# Patient Record
Sex: Male | Born: 1960 | Race: White | Hispanic: No | Marital: Married | Smoking: Former smoker
Health system: Southern US, Community
[De-identification: ages and names within clinical notes are randomized; demographics above are authoritative.]

## PROBLEM LIST (undated history)

## (undated) DIAGNOSIS — Z87442 Personal history of urinary calculi: Secondary | ICD-10-CM

## (undated) DIAGNOSIS — E785 Hyperlipidemia, unspecified: Secondary | ICD-10-CM

## (undated) DIAGNOSIS — Z950 Presence of cardiac pacemaker: Secondary | ICD-10-CM

## (undated) DIAGNOSIS — I739 Peripheral vascular disease, unspecified: Secondary | ICD-10-CM

## (undated) DIAGNOSIS — I255 Ischemic cardiomyopathy: Secondary | ICD-10-CM

## (undated) DIAGNOSIS — K219 Gastro-esophageal reflux disease without esophagitis: Secondary | ICD-10-CM

## (undated) DIAGNOSIS — I10 Essential (primary) hypertension: Secondary | ICD-10-CM

## (undated) DIAGNOSIS — G8929 Other chronic pain: Secondary | ICD-10-CM

## (undated) DIAGNOSIS — I2510 Atherosclerotic heart disease of native coronary artery without angina pectoris: Secondary | ICD-10-CM

## (undated) DIAGNOSIS — I219 Acute myocardial infarction, unspecified: Secondary | ICD-10-CM

## (undated) DIAGNOSIS — Z95810 Presence of automatic (implantable) cardiac defibrillator: Secondary | ICD-10-CM

## (undated) DIAGNOSIS — I5022 Chronic systolic (congestive) heart failure: Secondary | ICD-10-CM

## (undated) DIAGNOSIS — I509 Heart failure, unspecified: Secondary | ICD-10-CM

## (undated) HISTORY — PX: CARDIAC CATHETERIZATION: SHX172

## (undated) HISTORY — DX: Peripheral vascular disease, unspecified: I73.9

## (undated) HISTORY — PX: OTHER SURGICAL HISTORY: SHX169

## (undated) HISTORY — PX: CORONARY ANGIOPLASTY: SHX604

## (undated) HISTORY — PX: INSERT / REPLACE / REMOVE PACEMAKER: SUR710

---

## 2011-11-06 DIAGNOSIS — R9389 Abnormal findings on diagnostic imaging of other specified body structures: Secondary | ICD-10-CM | POA: Insufficient documentation

## 2011-11-06 DIAGNOSIS — I219 Acute myocardial infarction, unspecified: Secondary | ICD-10-CM | POA: Insufficient documentation

## 2012-04-18 DIAGNOSIS — M25519 Pain in unspecified shoulder: Secondary | ICD-10-CM | POA: Insufficient documentation

## 2012-05-30 DIAGNOSIS — G894 Chronic pain syndrome: Secondary | ICD-10-CM | POA: Insufficient documentation

## 2012-05-30 DIAGNOSIS — Z79891 Long term (current) use of opiate analgesic: Secondary | ICD-10-CM | POA: Insufficient documentation

## 2012-05-30 DIAGNOSIS — M5412 Radiculopathy, cervical region: Secondary | ICD-10-CM | POA: Insufficient documentation

## 2012-07-11 DIAGNOSIS — R0789 Other chest pain: Secondary | ICD-10-CM | POA: Insufficient documentation

## 2014-10-23 DIAGNOSIS — E876 Hypokalemia: Secondary | ICD-10-CM | POA: Insufficient documentation

## 2014-10-23 DIAGNOSIS — D509 Iron deficiency anemia, unspecified: Secondary | ICD-10-CM | POA: Insufficient documentation

## 2014-10-23 DIAGNOSIS — R079 Chest pain, unspecified: Secondary | ICD-10-CM | POA: Insufficient documentation

## 2015-02-07 DIAGNOSIS — I429 Cardiomyopathy, unspecified: Secondary | ICD-10-CM | POA: Insufficient documentation

## 2015-02-07 DIAGNOSIS — I5022 Chronic systolic (congestive) heart failure: Secondary | ICD-10-CM | POA: Insufficient documentation

## 2015-09-02 ENCOUNTER — Other Ambulatory Visit: Payer: Self-pay | Admitting: Neurosurgery

## 2015-09-02 DIAGNOSIS — M542 Cervicalgia: Principal | ICD-10-CM

## 2015-09-02 DIAGNOSIS — M5442 Lumbago with sciatica, left side: Secondary | ICD-10-CM

## 2015-09-02 DIAGNOSIS — G8929 Other chronic pain: Secondary | ICD-10-CM

## 2015-09-27 ENCOUNTER — Other Ambulatory Visit: Payer: Self-pay

## 2015-10-03 ENCOUNTER — Ambulatory Visit
Admission: RE | Admit: 2015-10-03 | Discharge: 2015-10-03 | Disposition: A | Discharge status: Home / Self Care | Payer: Worker's Compensation | Source: Ambulatory Visit | Attending: Neurosurgery | Admitting: Neurosurgery

## 2015-10-03 DIAGNOSIS — K219 Gastro-esophageal reflux disease without esophagitis: Secondary | ICD-10-CM | POA: Insufficient documentation

## 2015-10-03 DIAGNOSIS — M5442 Lumbago with sciatica, left side: Secondary | ICD-10-CM

## 2015-10-03 DIAGNOSIS — M542 Cervicalgia: Secondary | ICD-10-CM

## 2015-10-03 DIAGNOSIS — Z95810 Presence of automatic (implantable) cardiac defibrillator: Secondary | ICD-10-CM | POA: Insufficient documentation

## 2015-10-03 DIAGNOSIS — Z87891 Personal history of nicotine dependence: Secondary | ICD-10-CM | POA: Insufficient documentation

## 2015-10-03 DIAGNOSIS — I2510 Atherosclerotic heart disease of native coronary artery without angina pectoris: Secondary | ICD-10-CM | POA: Insufficient documentation

## 2015-10-03 DIAGNOSIS — G8929 Other chronic pain: Secondary | ICD-10-CM | POA: Insufficient documentation

## 2015-10-03 DIAGNOSIS — M5030 Other cervical disc degeneration, unspecified cervical region: Secondary | ICD-10-CM | POA: Insufficient documentation

## 2015-10-03 DIAGNOSIS — I252 Old myocardial infarction: Secondary | ICD-10-CM | POA: Insufficient documentation

## 2015-10-03 DIAGNOSIS — M545 Low back pain, unspecified: Secondary | ICD-10-CM | POA: Insufficient documentation

## 2015-10-03 DIAGNOSIS — Z955 Presence of coronary angioplasty implant and graft: Secondary | ICD-10-CM | POA: Insufficient documentation

## 2015-10-03 DIAGNOSIS — Z8249 Family history of ischemic heart disease and other diseases of the circulatory system: Secondary | ICD-10-CM | POA: Insufficient documentation

## 2015-10-03 DIAGNOSIS — I10 Essential (primary) hypertension: Secondary | ICD-10-CM | POA: Insufficient documentation

## 2015-10-03 DIAGNOSIS — E785 Hyperlipidemia, unspecified: Secondary | ICD-10-CM | POA: Insufficient documentation

## 2015-10-03 MED ORDER — DIAZEPAM 5 MG PO TABS
10.0000 mg | ORAL_TABLET | Freq: Once | ORAL | Status: AC
  Administered 2015-10-03: 10 mg via ORAL

## 2015-10-03 MED ORDER — IOPAMIDOL (ISOVUE-M 200) INJECTION 41%
15.0000 mL | Freq: Once | INTRAMUSCULAR | Status: AC
Start: 2015-10-03 — End: 2015-10-03
  Administered 2015-10-03: 15 mL via INTRATHECAL

## 2015-10-03 MED ORDER — MEPERIDINE HCL 100 MG/ML IJ SOLN
100.0000 mg | Freq: Once | INTRAMUSCULAR | Status: AC
  Administered 2015-10-03: 100 mg via INTRAMUSCULAR

## 2015-10-03 MED ORDER — HYDROXYZINE HCL 50 MG/ML IM SOLN
25.0000 mg | Freq: Once | INTRAMUSCULAR | Status: AC
  Administered 2015-10-03: 25 mg via INTRAMUSCULAR

## 2015-10-03 MED ORDER — ONDANSETRON 8 MG PO TBDP
8.0000 mg | ORAL_TABLET | Freq: Once | ORAL | Status: AC
  Administered 2015-10-03: 8 mg via ORAL

## 2015-10-03 NOTE — Discharge Instructions (Signed)
Myelogram Discharge Instructions  1. Go home and rest quietly for the next 24 hours.  It is important to lie flat for the next 24 hours.  Get up only to go to the restroom.  You may lie in the bed or on a couch on your back, your stomach, your left side or your right side.  You may have one pillow under your head.  You may have pillows between your knees while you are on your side or under your knees while you are on your back.  2. DO NOT drive today.  Recline the seat as far back as it will go, while still wearing your seat belt, on the way home.  3. You may get up to go to the bathroom as needed.  You may sit up for 10 minutes to eat.  You may resume your normal diet and medications unless otherwise indicated.  Drink lots of extra fluids today and tomorrow.  4. The incidence of headache, nausea, or vomiting is about 5% (one in 20 patients).  If you develop a headache, lie flat and drink plenty of fluids until the headache goes away.  Caffeinated beverages may be helpful.  If you develop severe nausea and vomiting or a headache that does not go away with flat bed rest, call [phone].  5. You may resume normal activities after your 24 hours of bed rest is over; however, do not exert yourself strongly or do any heavy lifting tomorrow. If when you get up you have a headache when standing, go back to bed and force fluids for another 24 hours.  6. Call your physician for a follow-up appointment.  The results of your myelogram will be sent directly to your physician by the following day.  7. If you have any questions or if complications develop after you arrive home, please call [phone].  Discharge instructions have been explained to the patient.  The patient, or the person responsible for the patient, fully understands these instructions.       MAY RESUME PLAVIX TODAY.   May resume Wellbutrin on October 04, 2015, after 11:00 am.

## 2015-10-03 NOTE — Progress Notes (Signed)
Patient states he has been off Wellbutrin for the past two days and Plavix for the past five days.

## 2017-04-30 DIAGNOSIS — M1990 Unspecified osteoarthritis, unspecified site: Secondary | ICD-10-CM

## 2017-04-30 HISTORY — DX: Unspecified osteoarthritis, unspecified site: M19.90

## 2019-06-14 ENCOUNTER — Encounter (HOSPITAL_COMMUNITY): Payer: Self-pay | Admitting: *Deleted

## 2019-06-14 ENCOUNTER — Other Ambulatory Visit: Payer: Self-pay

## 2019-06-14 ENCOUNTER — Inpatient Hospital Stay (HOSPITAL_COMMUNITY)
Admission: EM | Admit: 2019-06-14 | Discharge: 2019-06-16 | DRG: 291 | Disposition: A | Discharge status: Home / Self Care | Payer: Medicare Other | Attending: Internal Medicine | Admitting: Internal Medicine

## 2019-06-14 ENCOUNTER — Emergency Department (HOSPITAL_COMMUNITY): Payer: Medicare Other

## 2019-06-14 DIAGNOSIS — G894 Chronic pain syndrome: Secondary | ICD-10-CM | POA: Diagnosis present

## 2019-06-14 DIAGNOSIS — R4182 Altered mental status, unspecified: Secondary | ICD-10-CM | POA: Diagnosis not present

## 2019-06-14 DIAGNOSIS — I712 Thoracic aortic aneurysm, without rupture: Secondary | ICD-10-CM | POA: Diagnosis present

## 2019-06-14 DIAGNOSIS — G8929 Other chronic pain: Secondary | ICD-10-CM | POA: Diagnosis present

## 2019-06-14 DIAGNOSIS — E669 Obesity, unspecified: Secondary | ICD-10-CM | POA: Diagnosis present

## 2019-06-14 DIAGNOSIS — E785 Hyperlipidemia, unspecified: Secondary | ICD-10-CM | POA: Diagnosis present

## 2019-06-14 DIAGNOSIS — M542 Cervicalgia: Secondary | ICD-10-CM | POA: Diagnosis present

## 2019-06-14 DIAGNOSIS — Z981 Arthrodesis status: Secondary | ICD-10-CM

## 2019-06-14 DIAGNOSIS — I429 Cardiomyopathy, unspecified: Secondary | ICD-10-CM

## 2019-06-14 DIAGNOSIS — Z6832 Body mass index (BMI) 32.0-32.9, adult: Secondary | ICD-10-CM

## 2019-06-14 DIAGNOSIS — M5011 Cervical disc disorder with radiculopathy,  high cervical region: Secondary | ICD-10-CM | POA: Diagnosis present

## 2019-06-14 DIAGNOSIS — Z955 Presence of coronary angioplasty implant and graft: Secondary | ICD-10-CM

## 2019-06-14 DIAGNOSIS — I110 Hypertensive heart disease with heart failure: Principal | ICD-10-CM | POA: Diagnosis present

## 2019-06-14 DIAGNOSIS — Z8249 Family history of ischemic heart disease and other diseases of the circulatory system: Secondary | ICD-10-CM

## 2019-06-14 DIAGNOSIS — Z95810 Presence of automatic (implantable) cardiac defibrillator: Secondary | ICD-10-CM

## 2019-06-14 DIAGNOSIS — I255 Ischemic cardiomyopathy: Secondary | ICD-10-CM | POA: Diagnosis present

## 2019-06-14 DIAGNOSIS — Z7982 Long term (current) use of aspirin: Secondary | ICD-10-CM

## 2019-06-14 DIAGNOSIS — U071 COVID-19: Secondary | ICD-10-CM | POA: Diagnosis present

## 2019-06-14 DIAGNOSIS — D509 Iron deficiency anemia, unspecified: Secondary | ICD-10-CM | POA: Diagnosis present

## 2019-06-14 DIAGNOSIS — I2510 Atherosclerotic heart disease of native coronary artery without angina pectoris: Secondary | ICD-10-CM | POA: Diagnosis present

## 2019-06-14 DIAGNOSIS — N179 Acute kidney failure, unspecified: Secondary | ICD-10-CM | POA: Diagnosis present

## 2019-06-14 DIAGNOSIS — I4510 Unspecified right bundle-branch block: Secondary | ICD-10-CM | POA: Diagnosis present

## 2019-06-14 DIAGNOSIS — Z87891 Personal history of nicotine dependence: Secondary | ICD-10-CM

## 2019-06-14 DIAGNOSIS — Z888 Allergy status to other drugs, medicaments and biological substances status: Secondary | ICD-10-CM

## 2019-06-14 DIAGNOSIS — Z79899 Other long term (current) drug therapy: Secondary | ICD-10-CM

## 2019-06-14 DIAGNOSIS — K219 Gastro-esophageal reflux disease without esophagitis: Secondary | ICD-10-CM | POA: Diagnosis present

## 2019-06-14 DIAGNOSIS — G9341 Metabolic encephalopathy: Secondary | ICD-10-CM | POA: Diagnosis present

## 2019-06-14 DIAGNOSIS — I252 Old myocardial infarction: Secondary | ICD-10-CM

## 2019-06-14 DIAGNOSIS — I509 Heart failure, unspecified: Secondary | ICD-10-CM

## 2019-06-14 DIAGNOSIS — I5023 Acute on chronic systolic (congestive) heart failure: Secondary | ICD-10-CM | POA: Diagnosis present

## 2019-06-14 DIAGNOSIS — Z66 Do not resuscitate: Secondary | ICD-10-CM | POA: Diagnosis present

## 2019-06-14 HISTORY — DX: Essential (primary) hypertension: I10

## 2019-06-14 HISTORY — DX: Heart failure, unspecified: I50.9

## 2019-06-14 HISTORY — DX: Other chronic pain: G89.29

## 2019-06-14 HISTORY — DX: Chronic systolic (congestive) heart failure: I50.22

## 2019-06-14 HISTORY — DX: Presence of cardiac pacemaker: Z95.0

## 2019-06-14 HISTORY — DX: Hyperlipidemia, unspecified: E78.5

## 2019-06-14 HISTORY — DX: Atherosclerotic heart disease of native coronary artery without angina pectoris: I25.10

## 2019-06-14 HISTORY — DX: Ischemic cardiomyopathy: I25.5

## 2019-06-14 LAB — CBC WITH DIFFERENTIAL/PLATELET
Abs Immature Granulocytes: 0.03 10*3/uL (ref 0.00–0.07)
Basophils Absolute: 0 10*3/uL (ref 0.0–0.1)
Basophils Relative: 0 %
Eosinophils Absolute: 0.2 10*3/uL (ref 0.0–0.5)
Eosinophils Relative: 2 %
HCT: 39.3 % (ref 39.0–52.0)
Hemoglobin: 12.9 g/dL — ABNORMAL LOW (ref 13.0–17.0)
Immature Granulocytes: 0 %
Lymphocytes Relative: 20 %
Lymphs Abs: 1.7 10*3/uL (ref 0.7–4.0)
MCH: 29.9 pg (ref 26.0–34.0)
MCHC: 32.8 g/dL (ref 30.0–36.0)
MCV: 91.2 fL (ref 80.0–100.0)
Monocytes Absolute: 0.8 10*3/uL (ref 0.1–1.0)
Monocytes Relative: 9 %
Neutro Abs: 6.1 10*3/uL (ref 1.7–7.7)
Neutrophils Relative %: 69 %
Platelets: 201 10*3/uL (ref 150–400)
RBC: 4.31 MIL/uL (ref 4.22–5.81)
RDW: 12.3 % (ref 11.5–15.5)
WBC: 8.8 10*3/uL (ref 4.0–10.5)
nRBC: 0 % (ref 0.0–0.2)

## 2019-06-14 LAB — COMPREHENSIVE METABOLIC PANEL
ALT: 31 U/L (ref 0–44)
AST: 38 U/L (ref 15–41)
Albumin: 4 g/dL (ref 3.5–5.0)
Alkaline Phosphatase: 73 U/L (ref 38–126)
Anion gap: 9 (ref 5–15)
BUN: 43 mg/dL — ABNORMAL HIGH (ref 6–20)
CO2: 21 mmol/L — ABNORMAL LOW (ref 22–32)
Calcium: 8.7 mg/dL — ABNORMAL LOW (ref 8.9–10.3)
Chloride: 110 mmol/L (ref 98–111)
Creatinine, Ser: 1.83 mg/dL — ABNORMAL HIGH (ref 0.61–1.24)
GFR calc Af Amer: 46 mL/min — ABNORMAL LOW (ref >60)
GFR calc non Af Amer: 40 mL/min — ABNORMAL LOW (ref >60)
Glucose, Bld: 105 mg/dL — ABNORMAL HIGH (ref 70–99)
Potassium: 4.7 mmol/L (ref 3.5–5.1)
Sodium: 140 mmol/L (ref 135–145)
Total Bilirubin: 1.1 mg/dL (ref 0.3–1.2)
Total Protein: 6.5 g/dL (ref 6.5–8.1)

## 2019-06-14 LAB — AMMONIA: Ammonia: 22 umol/L (ref 9–35)

## 2019-06-14 LAB — BRAIN NATRIURETIC PEPTIDE: B Natriuretic Peptide: 303.7 pg/mL — ABNORMAL HIGH (ref 0.0–100.0)

## 2019-06-14 LAB — D-DIMER, QUANTITATIVE: D-Dimer, Quant: 0.79 ug/mL-FEU — ABNORMAL HIGH (ref 0.00–0.50)

## 2019-06-14 MED ORDER — IOHEXOL 350 MG/ML SOLN
60.0000 mL | Freq: Once | INTRAVENOUS | Status: AC | PRN
  Administered 2019-06-14: 23:00:00 60 mL via INTRAVENOUS

## 2019-06-14 MED ORDER — MORPHINE SULFATE (PF) 4 MG/ML IV SOLN
4.0000 mg | Freq: Once | INTRAVENOUS | Status: AC
  Administered 2019-06-14: 4 mg via INTRAVENOUS
  Filled 2019-06-14: qty 1

## 2019-06-14 NOTE — ED Notes (Signed)
Port chest x-ray done
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
## 2019-06-14 NOTE — ED Notes (Signed)
The pt continues to moan and groan med given
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
## 2019-06-14 NOTE — ED Notes (Signed)
The pt keeps getting up to void then gets tangled up in  The wires and cannot  Figure out to get intangled
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
## 2019-06-14 NOTE — ED Notes (Signed)
No bm for 2 weeks  Taking narcotics

## 2019-06-15 ENCOUNTER — Encounter (HOSPITAL_COMMUNITY): Payer: Self-pay | Admitting: Internal Medicine

## 2019-06-15 ENCOUNTER — Inpatient Hospital Stay (HOSPITAL_COMMUNITY): Payer: Medicare Other

## 2019-06-15 ENCOUNTER — Other Ambulatory Visit: Payer: Self-pay

## 2019-06-15 DIAGNOSIS — I5041 Acute combined systolic (congestive) and diastolic (congestive) heart failure: Secondary | ICD-10-CM | POA: Diagnosis not present

## 2019-06-15 DIAGNOSIS — Z955 Presence of coronary angioplasty implant and graft: Secondary | ICD-10-CM | POA: Diagnosis not present

## 2019-06-15 DIAGNOSIS — J9600 Acute respiratory failure, unspecified whether with hypoxia or hypercapnia: Secondary | ICD-10-CM | POA: Diagnosis not present

## 2019-06-15 DIAGNOSIS — N179 Acute kidney failure, unspecified: Secondary | ICD-10-CM | POA: Diagnosis present

## 2019-06-15 DIAGNOSIS — M542 Cervicalgia: Secondary | ICD-10-CM

## 2019-06-15 DIAGNOSIS — R609 Edema, unspecified: Secondary | ICD-10-CM

## 2019-06-15 DIAGNOSIS — I110 Hypertensive heart disease with heart failure: Secondary | ICD-10-CM | POA: Diagnosis present

## 2019-06-15 DIAGNOSIS — I429 Cardiomyopathy, unspecified: Secondary | ICD-10-CM | POA: Diagnosis not present

## 2019-06-15 DIAGNOSIS — I252 Old myocardial infarction: Secondary | ICD-10-CM | POA: Diagnosis not present

## 2019-06-15 DIAGNOSIS — G8929 Other chronic pain: Secondary | ICD-10-CM | POA: Diagnosis not present

## 2019-06-15 DIAGNOSIS — G894 Chronic pain syndrome: Secondary | ICD-10-CM | POA: Diagnosis present

## 2019-06-15 DIAGNOSIS — Z7189 Other specified counseling: Secondary | ICD-10-CM | POA: Diagnosis not present

## 2019-06-15 DIAGNOSIS — I5021 Acute systolic (congestive) heart failure: Secondary | ICD-10-CM | POA: Diagnosis not present

## 2019-06-15 DIAGNOSIS — E785 Hyperlipidemia, unspecified: Secondary | ICD-10-CM | POA: Diagnosis present

## 2019-06-15 DIAGNOSIS — I5023 Acute on chronic systolic (congestive) heart failure: Secondary | ICD-10-CM | POA: Diagnosis present

## 2019-06-15 DIAGNOSIS — I2510 Atherosclerotic heart disease of native coronary artery without angina pectoris: Secondary | ICD-10-CM | POA: Diagnosis present

## 2019-06-15 DIAGNOSIS — I255 Ischemic cardiomyopathy: Secondary | ICD-10-CM | POA: Diagnosis present

## 2019-06-15 DIAGNOSIS — Z981 Arthrodesis status: Secondary | ICD-10-CM | POA: Diagnosis not present

## 2019-06-15 DIAGNOSIS — Z95810 Presence of automatic (implantable) cardiac defibrillator: Secondary | ICD-10-CM

## 2019-06-15 DIAGNOSIS — E669 Obesity, unspecified: Secondary | ICD-10-CM | POA: Diagnosis present

## 2019-06-15 DIAGNOSIS — R4182 Altered mental status, unspecified: Secondary | ICD-10-CM | POA: Diagnosis present

## 2019-06-15 DIAGNOSIS — D509 Iron deficiency anemia, unspecified: Secondary | ICD-10-CM | POA: Diagnosis present

## 2019-06-15 DIAGNOSIS — I712 Thoracic aortic aneurysm, without rupture: Secondary | ICD-10-CM | POA: Diagnosis present

## 2019-06-15 DIAGNOSIS — Z87891 Personal history of nicotine dependence: Secondary | ICD-10-CM | POA: Diagnosis not present

## 2019-06-15 DIAGNOSIS — K219 Gastro-esophageal reflux disease without esophagitis: Secondary | ICD-10-CM | POA: Diagnosis present

## 2019-06-15 DIAGNOSIS — Z8249 Family history of ischemic heart disease and other diseases of the circulatory system: Secondary | ICD-10-CM | POA: Diagnosis not present

## 2019-06-15 DIAGNOSIS — U071 COVID-19: Secondary | ICD-10-CM | POA: Diagnosis present

## 2019-06-15 DIAGNOSIS — G9341 Metabolic encephalopathy: Secondary | ICD-10-CM | POA: Diagnosis present

## 2019-06-15 DIAGNOSIS — I4510 Unspecified right bundle-branch block: Secondary | ICD-10-CM | POA: Diagnosis present

## 2019-06-15 DIAGNOSIS — I509 Heart failure, unspecified: Secondary | ICD-10-CM

## 2019-06-15 DIAGNOSIS — Z6832 Body mass index (BMI) 32.0-32.9, adult: Secondary | ICD-10-CM | POA: Diagnosis not present

## 2019-06-15 DIAGNOSIS — Z66 Do not resuscitate: Secondary | ICD-10-CM | POA: Diagnosis present

## 2019-06-15 DIAGNOSIS — Z7982 Long term (current) use of aspirin: Secondary | ICD-10-CM | POA: Diagnosis not present

## 2019-06-15 DIAGNOSIS — M5011 Cervical disc disorder with radiculopathy,  high cervical region: Secondary | ICD-10-CM | POA: Diagnosis present

## 2019-06-15 LAB — CBC WITH DIFFERENTIAL/PLATELET
Abs Immature Granulocytes: 0.01 10*3/uL (ref 0.00–0.07)
Basophils Absolute: 0 10*3/uL (ref 0.0–0.1)
Basophils Relative: 0 %
Eosinophils Absolute: 0.1 10*3/uL (ref 0.0–0.5)
Eosinophils Relative: 1 %
HCT: 41.8 % (ref 39.0–52.0)
Hemoglobin: 14 g/dL (ref 13.0–17.0)
Immature Granulocytes: 0 %
Lymphocytes Relative: 22 %
Lymphs Abs: 1.7 10*3/uL (ref 0.7–4.0)
MCH: 29.7 pg (ref 26.0–34.0)
MCHC: 33.5 g/dL (ref 30.0–36.0)
MCV: 88.6 fL (ref 80.0–100.0)
Monocytes Absolute: 0.8 10*3/uL (ref 0.1–1.0)
Monocytes Relative: 10 %
Neutro Abs: 5 10*3/uL (ref 1.7–7.7)
Neutrophils Relative %: 67 %
Platelets: 232 10*3/uL (ref 150–400)
RBC: 4.72 MIL/uL (ref 4.22–5.81)
RDW: 12.5 % (ref 11.5–15.5)
WBC: 7.6 10*3/uL (ref 4.0–10.5)
nRBC: 0 % (ref 0.0–0.2)

## 2019-06-15 LAB — URINALYSIS, ROUTINE W REFLEX MICROSCOPIC
Bilirubin Urine: NEGATIVE
Glucose, UA: NEGATIVE mg/dL
Hgb urine dipstick: NEGATIVE
Ketones, ur: NEGATIVE mg/dL
Leukocytes,Ua: NEGATIVE
Nitrite: NEGATIVE
Protein, ur: NEGATIVE mg/dL
Specific Gravity, Urine: 1.006 (ref 1.005–1.030)
pH: 5 (ref 5.0–8.0)

## 2019-06-15 LAB — SARS CORONAVIRUS 2 (TAT 6-24 HRS): SARS Coronavirus 2: POSITIVE — AB

## 2019-06-15 LAB — HIV ANTIBODY (ROUTINE TESTING W REFLEX): HIV Screen 4th Generation wRfx: NONREACTIVE

## 2019-06-15 LAB — COMPREHENSIVE METABOLIC PANEL
ALT: 36 U/L (ref 0–44)
AST: 46 U/L — ABNORMAL HIGH (ref 15–41)
Albumin: 4.2 g/dL (ref 3.5–5.0)
Alkaline Phosphatase: 85 U/L (ref 38–126)
Anion gap: 14 (ref 5–15)
BUN: 36 mg/dL — ABNORMAL HIGH (ref 6–20)
CO2: 25 mmol/L (ref 22–32)
Calcium: 9.6 mg/dL (ref 8.9–10.3)
Chloride: 102 mmol/L (ref 98–111)
Creatinine, Ser: 1.74 mg/dL — ABNORMAL HIGH (ref 0.61–1.24)
GFR calc Af Amer: 49 mL/min — ABNORMAL LOW (ref >60)
GFR calc non Af Amer: 42 mL/min — ABNORMAL LOW (ref >60)
Glucose, Bld: 105 mg/dL — ABNORMAL HIGH (ref 70–99)
Potassium: 4.4 mmol/L (ref 3.5–5.1)
Sodium: 141 mmol/L (ref 135–145)
Total Bilirubin: 1.4 mg/dL — ABNORMAL HIGH (ref 0.3–1.2)
Total Protein: 6.9 g/dL (ref 6.5–8.1)

## 2019-06-15 LAB — TROPONIN I (HIGH SENSITIVITY)
Troponin I (High Sensitivity): 69 ng/L — ABNORMAL HIGH (ref <18)
Troponin I (High Sensitivity): 60 ng/L — ABNORMAL HIGH (ref <18)
Troponin I (High Sensitivity): 62 ng/L — ABNORMAL HIGH (ref <18)

## 2019-06-15 LAB — TSH: TSH: 0.295 u[IU]/mL — ABNORMAL LOW (ref 0.350–4.500)

## 2019-06-15 LAB — MAGNESIUM: Magnesium: 2 mg/dL (ref 1.7–2.4)

## 2019-06-15 LAB — SODIUM, URINE, RANDOM: Sodium, Ur: 130 mmol/L

## 2019-06-15 LAB — CREATININE, URINE, RANDOM: Creatinine, Urine: <10 mg/dL

## 2019-06-15 MED ORDER — AMIODARONE HCL 200 MG PO TABS
200.0000 mg | ORAL_TABLET | Freq: Every day | ORAL | Status: DC
  Administered 2019-06-15 – 2019-06-16 (×2): 200 mg via ORAL
  Filled 2019-06-15 (×2): qty 1

## 2019-06-15 MED ORDER — HYDROCODONE-ACETAMINOPHEN 5-325 MG PO TABS
1.0000 | ORAL_TABLET | Freq: Once | ORAL | Status: AC
  Administered 2019-06-15: 22:00:00 1 via ORAL
  Filled 2019-06-15: qty 1

## 2019-06-15 MED ORDER — SUCRALFATE 1 G PO TABS
1.0000 g | ORAL_TABLET | Freq: Three times a day (TID) | ORAL | Status: DC
  Administered 2019-06-15 – 2019-06-16 (×3): 1 g via ORAL
  Filled 2019-06-15 (×3): qty 1

## 2019-06-15 MED ORDER — ONDANSETRON HCL 4 MG/2ML IJ SOLN: 4.0000 mg | Freq: Four times a day (QID) | INTRAMUSCULAR | Status: DC | PRN

## 2019-06-15 MED ORDER — HYDROCODONE-ACETAMINOPHEN 5-325 MG PO TABS
0.5000 | ORAL_TABLET | ORAL | Status: DC | PRN
  Administered 2019-06-15 (×2): 0.5 via ORAL
  Filled 2019-06-15 (×2): qty 1

## 2019-06-15 MED ORDER — PANTOPRAZOLE SODIUM 40 MG PO TBEC: 40.0000 mg | DELAYED_RELEASE_TABLET | Freq: Every day | ORAL | Status: DC

## 2019-06-15 MED ORDER — FUROSEMIDE 40 MG PO TABS
40.0000 mg | ORAL_TABLET | Freq: Every day | ORAL | Status: DC
  Administered 2019-06-16: 40 mg via ORAL
  Filled 2019-06-15: qty 1

## 2019-06-15 MED ORDER — ASPIRIN EC 81 MG PO TBEC
81.0000 mg | DELAYED_RELEASE_TABLET | Freq: Every day | ORAL | Status: DC
  Administered 2019-06-15 – 2019-06-16 (×2): 81 mg via ORAL
  Filled 2019-06-15 (×2): qty 1

## 2019-06-15 MED ORDER — ATORVASTATIN CALCIUM 80 MG PO TABS
80.0000 mg | ORAL_TABLET | Freq: Every day | ORAL | Status: DC
  Administered 2019-06-15: 17:00:00 80 mg via ORAL
  Filled 2019-06-15: qty 1

## 2019-06-15 MED ORDER — FUROSEMIDE 10 MG/ML IJ SOLN
40.0000 mg | Freq: Two times a day (BID) | INTRAMUSCULAR | Status: DC
  Administered 2019-06-15: 07:00:00 40 mg via INTRAVENOUS
  Filled 2019-06-15: qty 4

## 2019-06-15 MED ORDER — PANTOPRAZOLE SODIUM 40 MG PO TBEC
40.0000 mg | DELAYED_RELEASE_TABLET | Freq: Every day | ORAL | Status: DC
  Administered 2019-06-15 – 2019-06-16 (×2): 40 mg via ORAL
  Filled 2019-06-15 (×3): qty 1

## 2019-06-15 MED ORDER — ONDANSETRON HCL 4 MG PO TABS: 4.0000 mg | ORAL_TABLET | Freq: Four times a day (QID) | ORAL | Status: DC | PRN

## 2019-06-15 MED ORDER — NITROGLYCERIN 0.4 MG SL SUBL: 0.4000 mg | SUBLINGUAL_TABLET | SUBLINGUAL | Status: DC | PRN

## 2019-06-15 MED ORDER — ACETAMINOPHEN 325 MG PO TABS: 650.0000 mg | ORAL_TABLET | Freq: Four times a day (QID) | ORAL | Status: DC | PRN

## 2019-06-15 MED ORDER — FUROSEMIDE 20 MG PO TABS: 20.0000 mg | ORAL_TABLET | Freq: Every day | ORAL | Status: DC

## 2019-06-15 MED ORDER — CARVEDILOL 12.5 MG PO TABS
12.5000 mg | ORAL_TABLET | Freq: Two times a day (BID) | ORAL | Status: DC
  Administered 2019-06-15 – 2019-06-16 (×2): 12.5 mg via ORAL
  Filled 2019-06-15 (×3): qty 1

## 2019-06-15 MED ORDER — ACETAMINOPHEN 650 MG RE SUPP: 650.0000 mg | Freq: Four times a day (QID) | RECTAL | Status: DC | PRN

## 2019-06-15 MED ORDER — LORAZEPAM 1 MG PO TABS
1.0000 mg | ORAL_TABLET | Freq: Once | ORAL | Status: AC
  Administered 2019-06-15: 02:00:00 1 mg via ORAL
  Filled 2019-06-15: qty 1

## 2019-06-15 MED ORDER — BUPROPION HCL ER (SR) 150 MG PO TB12
150.0000 mg | ORAL_TABLET | Freq: Two times a day (BID) | ORAL | Status: DC
  Administered 2019-06-15 – 2019-06-16 (×3): 150 mg via ORAL
  Filled 2019-06-15 (×4): qty 1

## 2019-06-15 MED ORDER — HYDROCODONE-ACETAMINOPHEN 5-325 MG PO TABS
1.0000 | ORAL_TABLET | ORAL | Status: DC | PRN
  Administered 2019-06-16 (×3): 1 via ORAL
  Filled 2019-06-15 (×3): qty 1

## 2019-06-15 MED ORDER — ENOXAPARIN SODIUM 40 MG/0.4ML ~~LOC~~ SOLN
40.0000 mg | Freq: Every day | SUBCUTANEOUS | Status: DC
  Administered 2019-06-15: 40 mg via SUBCUTANEOUS
  Filled 2019-06-15: qty 0.4

## 2019-06-15 MED ORDER — DEXAMETHASONE 6 MG PO TABS
10.0000 mg | ORAL_TABLET | Freq: Four times a day (QID) | ORAL | Status: AC
  Administered 2019-06-15 – 2019-06-16 (×4): 10 mg via ORAL
  Filled 2019-06-15 (×2): qty 1
  Filled 2019-06-15: qty 3
  Filled 2019-06-15: qty 1
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
## 2019-06-15 NOTE — ED Notes (Signed)
C/o neck pain now

## 2019-06-15 NOTE — Progress Notes (Signed)
CRITICAL VALUE ALERT  Critical Value:  COVID 78 Positiv   Date & Time Notied:  06/15/2019 at 0927  Provider Notified: R Pahwani  Orders Received/Actions taken: Isolation Precaution Per protocol and Tx Pt to 5W

## 2019-06-15 NOTE — ED Notes (Signed)
The pt was asked to return to the stretcher he wants to sit up  C.o difficulty breathing shen he lies back to fall asleep  sats 98-100 he wants something for pain  No orders  Placed yet.   Yells and curses when no one is in room  Polite and saying thank you whenever someone comes into the room

## 2019-06-15 NOTE — Plan of Care (Signed)
Initiate care plan  Problem: Education: Goal: Knowledge of risk factors and measures for prevention of condition will improve Outcome: Progressing   Problem: Coping: Goal: Psychosocial and spiritual needs will be supported Outcome: Progressing   Problem: Respiratory: Goal: Will maintain a patent airway Outcome: Progressing Goal: Complications related to the disease process, condition or treatment will be avoided or minimized Outcome: Progressing   Problem: Education: Goal: Knowledge of General Education information will improve Description: Including pain rating scale, medication(s)/side effects and non-pharmacologic comfort measures Outcome: Progressing   Problem: Health Behavior/Discharge Planning: Goal: Ability to manage health-related needs will improve Outcome: Progressing   Problem: Clinical Measurements: Goal: Ability to maintain clinical measurements within normal limits will improve Outcome: Progressing Goal: Will remain free from infection Outcome: Progressing Goal: Diagnostic test results will improve Outcome: Progressing Goal: Respiratory complications will improve Outcome: Progressing Goal: Cardiovascular complication will be avoided Outcome: Progressing   Problem: Activity: Goal: Risk for activity intolerance will decrease Outcome: Progressing   Problem: Nutrition: Goal: Adequate nutrition will be maintained Outcome: Progressing   Problem: Coping: Goal: Level of anxiety will decrease Outcome: Progressing   Problem: Elimination: Goal: Will not experience complications related to bowel motility Outcome: Progressing Goal: Will not experience complications related to urinary retention Outcome: Progressing   Problem: Pain Managment: Goal: General experience of comfort will improve Outcome: Progressing   Problem: Safety: Goal: Ability to remain free from injury will improve Outcome: Progressing   Problem: Skin Integrity: Goal: Risk for impaired  skin integrity will decrease Outcome: Progressing

## 2019-06-15 NOTE — Plan of Care (Signed)
  Problem: Education: Goal: Knowledge of risk factors and measures for prevention of condition will improve Outcome: Progressing   Problem: Coping: Goal: Psychosocial and spiritual needs will be supported Outcome: Progressing   Problem: Respiratory: Goal: Will maintain a patent airway Outcome: Progressing Goal: Complications related to the disease process, condition or treatment will be avoided or minimized Outcome: Progressing   Problem: Education: Goal: Knowledge of General Education information will improve Description: Including pain rating scale, medication(s)/side effects and non-pharmacologic comfort measures Outcome: Progressing   Problem: Health Behavior/Discharge Planning: Goal: Ability to manage health-related needs will improve Outcome: Progressing   Problem: Clinical Measurements: Goal: Ability to maintain clinical measurements within normal limits will improve Outcome: Progressing Goal: Will remain free from infection Outcome: Progressing Goal: Diagnostic test results will improve Outcome: Progressing Goal: Respiratory complications will improve Outcome: Progressing Goal: Cardiovascular complication will be avoided Outcome: Progressing   Problem: Activity: Goal: Risk for activity intolerance will decrease Outcome: Progressing   Problem: Nutrition: Goal: Adequate nutrition will be maintained Outcome: Progressing   Problem: Coping: Goal: Level of anxiety will decrease Outcome: Progressing   Problem: Elimination: Goal: Will not experience complications related to bowel motility Outcome: Progressing Goal: Will not experience complications related to urinary retention Outcome: Progressing   Problem: Pain Managment: Goal: General experience of comfort will improve Outcome: Progressing   Problem: Safety: Goal: Ability to remain free from injury will improve Outcome: Progressing   Problem: Skin Integrity: Goal: Risk for impaired skin integrity will  decrease Outcome: Progressing   Problem: Education: Goal: Knowledge of General Education information will improve Description: Including pain rating scale, medication(s)/side effects and non-pharmacologic comfort measures Outcome: Progressing   Problem: Health Behavior/Discharge Planning: Goal: Ability to manage health-related needs will improve Outcome: Progressing   Problem: Clinical Measurements: Goal: Ability to maintain clinical measurements within normal limits will improve Outcome: Progressing Goal: Will remain free from infection Outcome: Progressing Goal: Diagnostic test results will improve Outcome: Progressing Goal: Respiratory complications will improve Outcome: Progressing Goal: Cardiovascular complication will be avoided Outcome: Progressing   Problem: Activity: Goal: Risk for activity intolerance will decrease Outcome: Progressing   Problem: Nutrition: Goal: Adequate nutrition will be maintained Outcome: Progressing   Problem: Coping: Goal: Level of anxiety will decrease Outcome: Progressing   Problem: Elimination: Goal: Will not experience complications related to bowel motility Outcome: Progressing Goal: Will not experience complications related to urinary retention Outcome: Progressing   Problem: Pain Managment: Goal: General experience of comfort will improve Outcome: Progressing   Problem: Safety: Goal: Ability to remain free from injury will improve Outcome: Progressing   Problem: Skin Integrity: Goal: Risk for impaired skin integrity will decrease Outcome: Progressing   

## 2019-06-15 NOTE — Progress Notes (Signed)
Bilateral lower extremity venous duplex exam completed.  Preliminary results can be found under CV proc under chart review.  06/15/2019 12:54 PM  Atiana Levier, K., RDMS, RVT
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
## 2019-06-15 NOTE — ED Notes (Signed)
Breakfast Ordered 

## 2019-06-15 NOTE — ED Provider Notes (Signed)
Patient signed out to me by Dr. Anitra Lauth to follow-up on lab work.  Patient seen with complaints of progressively worsening shortness of breath, orthopnea, lower extremity edema.  Patient does have a history of ischemic cardiomyopathy with ejection fraction last year of 30 to 35%.  He has had previous LAD stent.  He is not experiencing chest pain currently.  EKG with inferior ST depressions and T wave inversions.  No previous EKG in our system, but narrative from recent hospitalization at [facility] as stated T wave inversions in inferior leads.  Patient does have mildly elevated BNP of 300.  First troponin of 69.  Although he does not have significant edema on x-ray and CT, suspect that his orthopnea, paroxysmal nocturnal dyspnea and persistent shortness of breath is cardiac in origin.  Will require hospitalization.   Gilda Crease, MD 06/15/19 [phone]

## 2019-06-15 NOTE — ED Notes (Signed)
Report called to rn on 2c 

## 2019-06-15 NOTE — Progress Notes (Signed)
Pt transferred onto unit from [facility] AOx4. IV intact. Call bell in reach. Will continue to eval and treat per MD orders.

## 2019-06-15 NOTE — ED Notes (Signed)
Pt not staying in bed all cables off and he is c/o pain  No pain meds  Ordered yet

## 2019-06-15 NOTE — Progress Notes (Signed)
   Vital Signs MEWS/VS Documentation      06/15/2019 1521 06/15/2019 1903 06/15/2019 2049 06/15/2019 2146   MEWS Score:  1  1  1  1    MEWS Score Color:   Chilton Si  Green  Green   Pulse:  --  --  --  78   BP:  --  --  --  103/86   Temp:  --  --  --  97.6 F (36.4 C)   Level of Consciousness:  --  --  Alert  --           Chilton Si 06/15/2019,10:40 PM
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
## 2019-06-16 DIAGNOSIS — I5041 Acute combined systolic (congestive) and diastolic (congestive) heart failure: Secondary | ICD-10-CM

## 2019-06-16 DIAGNOSIS — I429 Cardiomyopathy, unspecified: Secondary | ICD-10-CM

## 2019-06-16 DIAGNOSIS — N179 Acute kidney failure, unspecified: Secondary | ICD-10-CM

## 2019-06-16 LAB — CBC
HCT: 38.9 % — ABNORMAL LOW (ref 39.0–52.0)
Hemoglobin: 13.2 g/dL (ref 13.0–17.0)
MCH: 30.3 pg (ref 26.0–34.0)
MCHC: 33.9 g/dL (ref 30.0–36.0)
MCV: 89.2 fL (ref 80.0–100.0)
Platelets: 219 10*3/uL (ref 150–400)
RBC: 4.36 MIL/uL (ref 4.22–5.81)
RDW: 12.1 % (ref 11.5–15.5)
WBC: 6.2 10*3/uL (ref 4.0–10.5)
nRBC: 0 % (ref 0.0–0.2)

## 2019-06-16 LAB — BASIC METABOLIC PANEL
Anion gap: 13 (ref 5–15)
BUN: 51 mg/dL — ABNORMAL HIGH (ref 6–20)
CO2: 22 mmol/L (ref 22–32)
Calcium: 9.1 mg/dL (ref 8.9–10.3)
Chloride: 104 mmol/L (ref 98–111)
Creatinine, Ser: 1.67 mg/dL — ABNORMAL HIGH (ref 0.61–1.24)
GFR calc Af Amer: 51 mL/min — ABNORMAL LOW (ref >60)
GFR calc non Af Amer: 44 mL/min — ABNORMAL LOW (ref >60)
Glucose, Bld: 162 mg/dL — ABNORMAL HIGH (ref 70–99)
Potassium: 4.4 mmol/L (ref 3.5–5.1)
Sodium: 139 mmol/L (ref 135–145)

## 2019-06-16 MED ORDER — DEXAMETHASONE 6 MG PO TABS: 6.0000 mg | ORAL_TABLET | Freq: Every day | ORAL | 0 refills | Status: DC

## 2019-06-16 MED ORDER — LISINOPRIL 5 MG PO TABS: 5.0000 mg | ORAL_TABLET | Freq: Two times a day (BID) | ORAL | Status: AC

## 2019-06-16 MED ORDER — ASPIRIN 81 MG PO TBEC: 81.0000 mg | DELAYED_RELEASE_TABLET | Freq: Every day | ORAL | 0 refills | Status: DC

## 2019-06-16 MED ORDER — ACETAMINOPHEN 325 MG PO TABS: 650.0000 mg | ORAL_TABLET | Freq: Four times a day (QID) | ORAL | Status: DC | PRN

## 2019-06-16 MED ORDER — FUROSEMIDE 40 MG PO TABS: 40.0000 mg | ORAL_TABLET | Freq: Every day | ORAL | 0 refills | Status: AC
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
## 2019-06-16 NOTE — Discharge Instructions (Signed)
Person Under Monitoring Name: Jason Thomas  Location: [address] [place] [address]   Infection Prevention Recommendations for Individuals Confirmed to have, or Being Evaluated for, 2019 Novel Coronavirus (COVID-19) Infection Who Receive Care at Home  Individuals who are confirmed to have, or are being evaluated for, COVID-19 should follow the prevention steps below until a healthcare provider or local or state health department says they can return to normal activities.  Stay home except to get medical care You should restrict activities outside your home, except for getting medical care. Do not go to work, school, or public areas, and do not use public transportation or taxis.  Call ahead before visiting your doctor Before your medical appointment, call the healthcare provider and tell them that you have, or are being evaluated for, COVID-19 infection. This will help the healthcare providers office take steps to keep other people from getting infected. Ask your healthcare provider to call the local or state health department.  Monitor your symptoms Seek prompt medical attention if your illness is worsening (e.g., difficulty breathing). Before going to your medical appointment, call the healthcare provider and tell them that you have, or are being evaluated for, COVID-19 infection. Ask your healthcare provider to call the local or state health department.  Wear a facemask You should wear a facemask that covers your nose and mouth when you are in the same room with other people and when you visit a healthcare provider. People who live with or visit you should also wear a facemask while they are in the same room with you.  Separate yourself from other people in your home As much as possible, you should stay in a different room from other people in your home. Also, you should use a separate bathroom, if available.  Avoid sharing household items You should not share  dishes, drinking glasses, cups, eating utensils, towels, bedding, or other items with other people in your home. After using these items, you should wash them thoroughly with soap and water.  Cover your coughs and sneezes Cover your mouth and nose with a tissue when you cough or sneeze, or you can cough or sneeze into your sleeve. Throw used tissues in a lined trash can, and immediately wash your hands with soap and water for at least 20 seconds or use an alcohol-based hand rub.  Wash your [employer] your hands often and thoroughly with soap and water for at least 20 seconds. You can use an alcohol-based hand sanitizer if soap and water are not available and if your hands are not visibly dirty. Avoid touching your eyes, nose, and mouth with unwashed hands.   Prevention Steps for Caregivers and Household Members of Individuals Confirmed to have, or Being Evaluated for, COVID-19 Infection Being Cared for in the Home  If you live with, or provide care at home for, a person confirmed to have, or being evaluated for, COVID-19 infection please follow these guidelines to prevent infection:  Follow healthcare providers instructions Make sure that you understand and can help the patient follow any healthcare provider instructions for all care.  Provide for the patients basic needs You should help the patient with basic needs in the home and provide support for getting groceries, prescriptions, and other personal needs.  Monitor the patients symptoms If they are getting sicker, call his or her medical provider and tell them that the patient has, or is being evaluated for, COVID-19 infection. This will help the healthcare providers office take  steps to keep other people from getting infected. Ask the healthcare provider to call the local or state health department.  Limit the number of people who have contact with the patient  If possible, have only one caregiver for the patient.  Other  household members should stay in another home or place of residence. If this is not possible, they should stay  in another room, or be separated from the patient as much as possible. Use a separate bathroom, if available.  Restrict visitors who do not have an essential need to be in the home.  Keep older adults, very young children, and other sick people away from the patient Keep older adults, very young children, and those who have compromised immune systems or chronic health conditions away from the patient. This includes people with chronic heart, lung, or kidney conditions, diabetes, and cancer.  Ensure good ventilation Make sure that shared spaces in the home have good air flow, such as from an air conditioner or an opened window, weather permitting.  Wash your hands often  Wash your hands often and thoroughly with soap and water for at least 20 seconds. You can use an alcohol based hand sanitizer if soap and water are not available and if your hands are not visibly dirty.  Avoid touching your eyes, nose, and mouth with unwashed hands.  Use disposable paper towels to dry your hands. If not available, use dedicated cloth towels and replace them when they become wet.  Wear a facemask and gloves  Wear a disposable facemask at all times in the room and gloves when you touch or have contact with the patients blood, body fluids, and/or secretions or excretions, such as sweat, saliva, sputum, nasal mucus, vomit, urine, or feces.  Ensure the mask fits over your nose and mouth tightly, and do not touch it during use.  Throw out disposable facemasks and gloves after using them. Do not reuse.  Wash your hands immediately after removing your facemask and gloves.  If your personal clothing becomes contaminated, carefully remove clothing and launder. Wash your hands after handling contaminated clothing.  Place all used disposable facemasks, gloves, and other waste in a lined container before  disposing them with other household waste.  Remove gloves and wash your hands immediately after handling these items.  Do not share dishes, glasses, or other household items with the patient  Avoid sharing household items. You should not share dishes, drinking glasses, cups, eating utensils, towels, bedding, or other items with a patient who is confirmed to have, or being evaluated for, COVID-19 infection.  After the person uses these items, you should wash them thoroughly with soap and water.  Wash laundry thoroughly  Immediately remove and wash clothes or bedding that have blood, body fluids, and/or secretions or excretions, such as sweat, saliva, sputum, nasal mucus, vomit, urine, or feces, on them.  Wear gloves when handling laundry from the patient.  Read and follow directions on labels of laundry or clothing items and detergent. In general, wash and dry with the warmest temperatures recommended on the label.  Clean all areas the individual has used often  Clean all touchable surfaces, such as counters, tabletops, doorknobs, bathroom fixtures, toilets, phones, keyboards, tablets, and bedside tables, every day. Also, clean any surfaces that may have blood, body fluids, and/or secretions or excretions on them.  Wear gloves when cleaning surfaces the patient has come in contact with.  Use a diluted bleach solution (e.g., dilute bleach with 1 part bleach  and 10 parts water) or a household disinfectant with a label that says EPA-registered for coronaviruses. To make a bleach solution at home, add 1 tablespoon of bleach to 1 quart (4 cups) of water. For a larger supply, add  cup of bleach to 1 gallon (16 cups) of water.  Read labels of cleaning products and follow recommendations provided on product labels. Labels contain instructions for safe and effective use of the cleaning product including precautions you should take when applying the product, such as wearing gloves or eye protection  and making sure you have good ventilation during use of the product.  Remove gloves and wash hands immediately after cleaning.  Monitor yourself for signs and symptoms of illness Caregivers and household members are considered close contacts, should monitor their health, and will be asked to limit movement outside of the home to the extent possible. Follow the monitoring steps for close contacts listed on the symptom monitoring form.   ? If you have additional questions, contact your local health department or call the epidemiologist on call at [phone] (available 24/7). ? This guidance is subject to change. For the most up-to-date guidance from [facility], please refer to their website: [email or web address]
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
## 2019-06-16 NOTE — Progress Notes (Signed)
   06/16/19 0900  Clinical Encounter Type  Visited With Health care provider  Visit Type Initial  Referral From Nurse  Consult/Referral To Chaplain  Spiritual Encounters  Spiritual Needs Literature   Chaplain responding to consult for AD. At this time we are unable to complete Advanced Directives on COVID units due to logistical issues with volunteer witnesses and notaries. We will be available to complete the AD following the 21 day quarantine. Chaplains remain available for support as needs arise.   Chaplain Resident, Amado Coe, M Div [phone] on-call pager

## 2019-07-28 ENCOUNTER — Other Ambulatory Visit: Payer: Self-pay | Admitting: Neurosurgery

## 2019-07-29 ENCOUNTER — Other Ambulatory Visit: Payer: Self-pay | Admitting: Neurosurgery

## 2019-08-05 NOTE — Progress Notes (Signed)
[facility] - [place], [place] - [address] [place] [address] Phone: [phone] Fax: [phone]  [facility] - [place], [place] - [address] [place] [address] [place] [address] Phone: [phone] Fax: [phone]     Your procedure is scheduled on Monday, April 12th, from 11:00 AM to 1:59 PM.  Report to [facility] Main Entrance "A" at 09:00 A.M., and check in at the Admitting office.  Call this number if you have problems the morning of surgery:  [phone]  Call [phone] if you have any questions prior to your surgery date Monday-Friday 8am-4pm.    Remember:  Do not eat or drink after midnight the night before your surgery.     Take these medicines the morning of surgery with A SIP OF WATER : amiodarone (PACERONE) atorvastatin (LIPITOR)     buPROPion (WELLBUTRIN SR) carvedilol (COREG) ezetimibe (ZETIA)  IF NEEDED: acetaminophen (TYLENOL) nitroGLYCERIN (NITROSTAT) oxyCODONE-acetaminophen (PERCOCET)  **Follow your surgeon's instructions on when to stop Aspirin.  If no instructions were given by your surgeon then you will need to call the office to get those instructions.**     As of today, STOP taking any Aspirin containing products, Aleve, Naproxen, Ibuprofen, Motrin, Advil, Goody's, BC's, all herbal medications, fish oil, and all vitamins.                      Do not wear jewelry.            Do not wear lotions, powders, colognes, or deodorant.            Men may shave face and neck.            Do not bring valuables to the hospital.            [facility] is not responsible for any belongings or valuables.  Do NOT Smoke (Tobacco/Vapping) or drink Alcohol 24 hours prior to your procedure.  If you use a CPAP at night, you may bring all equipment for your overnight stay.   Contacts, glasses, dentures or bridgework may not be worn into surgery.      For patients admitted to the hospital, discharge time will be determined  by your treatment team.   Patients discharged the day of surgery will not be allowed to drive home, and someone needs to stay with them for 24 hours.    Special instructions:   [facility]- Preparing For Surgery  Before surgery, you can play an important role. Because skin is not sterile, your skin needs to be as free of germs as possible. You can reduce the number of germs on your skin by washing with CHG (chlorahexidine gluconate) Soap before surgery.  CHG is an antiseptic cleaner which kills germs and bonds with the skin to continue killing germs even after washing.    Oral Hygiene is also important to reduce your risk of infection.  Remember - BRUSH YOUR TEETH THE MORNING OF SURGERY WITH YOUR REGULAR TOOTHPASTE  Please do not use if you have an allergy to CHG or antibacterial soaps. If your skin becomes reddened/irritated stop using the CHG.  Do not shave (including legs and underarms) for at least 48 hours prior to first CHG shower. It is OK to shave your face.  Please follow these instructions carefully.   1. Shower the NIGHT BEFORE SURGERY and the MORNING OF SURGERY with CHG Soap.   2. If you chose to  wash your hair, wash your hair first as usual with your normal shampoo.  3. After you shampoo, rinse your hair and body thoroughly to remove the shampoo.  4. Use CHG as you would any other liquid soap. You can apply CHG directly to the skin and wash gently with a scrungie or a clean washcloth.   5. Apply the CHG Soap to your body ONLY FROM THE NECK DOWN.  Do not use on open wounds or open sores. Avoid contact with your eyes, ears, mouth and genitals (private parts). Wash Face and genitals (private parts)  with your normal soap.   6. Wash thoroughly, paying special attention to the area where your surgery will be performed.  7. Thoroughly rinse your body with warm water from the neck down.  8. DO NOT shower/wash with your normal soap after using and rinsing off the CHG  Soap.  9. Pat yourself dry with a CLEAN TOWEL.  10. Wear CLEAN PAJAMAS to bed the night before surgery, wear comfortable clothes the morning of surgery  11. Place CLEAN SHEETS on your bed the night of your first shower and DO NOT SLEEP WITH PETS.   Day of Surgery:   Do not apply any deodorants/lotions.  Please wear clean clothes to the hospital/surgery center.   Remember to brush your teeth WITH YOUR REGULAR TOOTHPASTE.   Please read over the following fact sheets that you were given.

## 2019-08-05 NOTE — Progress Notes (Signed)
Patient had a Biot[employer]ik device. Device orders requested from [facility] via e-mail, Cyndia Diver, RN cc'd. Called and notified device Rep. Brent at [phone].

## 2019-08-05 NOTE — Progress Notes (Addendum)
LVM informing the pt not to show up for covid test on 4/8, as pt tested + for covid on 06/15/19. Based on the guidelines the pt is in the 90 day window to not retest. The pt is still expected to quarantine until their procedure. Therefore, the pt can still have the scheduled procedure. These are the guidelines as follows:  Guidance: Patient previously tested + COVID; now past 90 day window seeking elective surgery (asymptomatic)  Retest patient If negative, proceed with surgery If positive, postpone surgery for 10 days from positive test Patient to quarantine for the (10 days) Do not retest again prior to surgery (even if scheduled a couple of weeks out) Use standard precautions for surgery

## 2019-08-06 ENCOUNTER — Other Ambulatory Visit: Payer: Self-pay

## 2019-08-06 ENCOUNTER — Encounter (HOSPITAL_COMMUNITY)
Admission: RE | Admit: 2019-08-06 | Discharge: 2019-08-06 | Disposition: A | Discharge status: Home / Self Care | Payer: Medicare Other | Source: Ambulatory Visit | Attending: Neurosurgery | Admitting: Neurosurgery

## 2019-08-06 ENCOUNTER — Inpatient Hospital Stay (HOSPITAL_COMMUNITY)
Admission: RE | Admit: 2019-08-06 | Discharge: 2019-08-06 | Disposition: A | Discharge status: Home / Self Care | Payer: Medicare Other | Source: Ambulatory Visit

## 2019-08-06 ENCOUNTER — Encounter (HOSPITAL_COMMUNITY): Payer: Self-pay

## 2019-08-06 DIAGNOSIS — I509 Heart failure, unspecified: Secondary | ICD-10-CM | POA: Diagnosis not present

## 2019-08-06 DIAGNOSIS — I110 Hypertensive heart disease with heart failure: Secondary | ICD-10-CM | POA: Diagnosis not present

## 2019-08-06 DIAGNOSIS — Z8616 Personal history of COVID-19: Secondary | ICD-10-CM | POA: Insufficient documentation

## 2019-08-06 DIAGNOSIS — Z955 Presence of coronary angioplasty implant and graft: Secondary | ICD-10-CM | POA: Insufficient documentation

## 2019-08-06 DIAGNOSIS — I2510 Atherosclerotic heart disease of native coronary artery without angina pectoris: Secondary | ICD-10-CM | POA: Diagnosis not present

## 2019-08-06 DIAGNOSIS — Z95810 Presence of automatic (implantable) cardiac defibrillator: Secondary | ICD-10-CM | POA: Diagnosis not present

## 2019-08-06 DIAGNOSIS — Z01818 Encounter for other preprocedural examination: Secondary | ICD-10-CM | POA: Diagnosis present

## 2019-08-06 DIAGNOSIS — I252 Old myocardial infarction: Secondary | ICD-10-CM | POA: Insufficient documentation

## 2019-08-06 HISTORY — DX: Acute myocardial infarction, unspecified: I21.9

## 2019-08-06 HISTORY — DX: Gastro-esophageal reflux disease without esophagitis: K21.9

## 2019-08-06 HISTORY — DX: Personal history of urinary calculi: Z87.442

## 2019-08-06 LAB — CBC
HCT: 40.3 % (ref 39.0–52.0)
Hemoglobin: 13.2 g/dL (ref 13.0–17.0)
MCH: 29.8 pg (ref 26.0–34.0)
MCHC: 32.8 g/dL (ref 30.0–36.0)
MCV: 91 fL (ref 80.0–100.0)
Platelets: 189 10*3/uL (ref 150–400)
RBC: 4.43 MIL/uL (ref 4.22–5.81)
RDW: 12.4 % (ref 11.5–15.5)
WBC: 8.3 10*3/uL (ref 4.0–10.5)
nRBC: 0 % (ref 0.0–0.2)

## 2019-08-06 LAB — BASIC METABOLIC PANEL
Anion gap: 11 (ref 5–15)
BUN: 29 mg/dL — ABNORMAL HIGH (ref 6–20)
CO2: 23 mmol/L (ref 22–32)
Calcium: 9.1 mg/dL (ref 8.9–10.3)
Chloride: 105 mmol/L (ref 98–111)
Creatinine, Ser: 1.63 mg/dL — ABNORMAL HIGH (ref 0.61–1.24)
GFR calc Af Amer: 53 mL/min — ABNORMAL LOW (ref >60)
GFR calc non Af Amer: 46 mL/min — ABNORMAL LOW (ref >60)
Glucose, Bld: 115 mg/dL — ABNORMAL HIGH (ref 70–99)
Potassium: 4.8 mmol/L (ref 3.5–5.1)
Sodium: 139 mmol/L (ref 135–145)

## 2019-08-06 LAB — TYPE AND SCREEN
ABO/RH(D): A POS
Antibody Screen: NEGATIVE

## 2019-08-06 LAB — SURGICAL PCR SCREEN
MRSA, PCR: POSITIVE — AB
Staphylococcus aureus: POSITIVE — AB

## 2019-08-06 LAB — ABO/RH: ABO/RH(D): A POS

## 2019-08-06 NOTE — Progress Notes (Addendum)
PCP - Dr. Maryjean Ka / [facility] Cardiologist - Dr. Vernona Rieger  PPM/ICD - Yes Bi[employer] ICD Device Orders - Faxed  Rep Notified - Yes. Device Rep. Brent notified 08/05/19.  Chest x-ray - 06/14/19 EKG - 06/14/19 Stress Test - 2016 ECHO - 02/06/19 Cardiac Cath - 11/01/2016  Sleep Study - Denies  DM - Denies  Blood Thinner Instructions:Stopped on 08/05/19 per patient Aspirin Instructions:Stopped on 08/05/19 per patient  COVID TEST- Not needed was Covid + within 90 days  Anesthesia review: Yes has ICD and CHF (SOB on exertion)  Patient denies shortness of breath, fever, cough and chest pain at PAT appointment  All instructions explained to the patient, with a verbal understanding of the material. Patient agrees to go over the instructions while at home for a better understanding. Patient also instructed to self quarantine after being tested for COVID-19. The opportunity to ask questions was provided.

## 2019-08-06 NOTE — Progress Notes (Signed)
Called in PCR results (+staph and +mrsa) to Tretha Sciara scheduler for Dr. Lovell Sheehan.

## 2019-08-07 MED ORDER — CEFAZOLIN SODIUM-DEXTROSE 2-4 GM/100ML-% IV SOLN
2.0000 g | INTRAVENOUS | Status: AC
  Administered 2019-08-10: 11:00:00 2 g via INTRAVENOUS
  Filled 2019-08-07: qty 100

## 2019-08-07 MED ORDER — VANCOMYCIN HCL 1500 MG/300ML IV SOLN
1500.0000 mg | INTRAVENOUS | Status: AC
  Administered 2019-08-10: 09:00:00 1500 mg via INTRAVENOUS
  Filled 2019-08-07: qty 300

## 2019-08-07 NOTE — Anesthesia Preprocedure Evaluation (Addendum)
Anesthesia Evaluation  Patient identified by MRN, date of birth, ID band Patient awake    Reviewed: Allergy & Precautions, NPO status , Patient's Chart, lab work & pertinent test results  History of Anesthesia Complications Negative for: history of anesthetic complications  Airway Mallampati: I  TM Distance: >3 FB Neck ROM: Full    Dental  (+) Edentulous Upper, Edentulous Lower   Pulmonary former smoker,  H/o positive Coronavirus 06/2019   breath sounds clear to auscultation       Cardiovascular hypertension, Pt. on medications and Pt. on home beta blockers + CAD, + Past MI and +CHF  + dysrhythmias Atrial Fibrillation + pacemaker (back up pacing at 40) + Cardiac Defibrillator (single chamber, has never shocked pt)  Rhythm:Irregular Rate:Normal  '20 ECHO: LVEF estimated at 30-35%, Mild dilation of the aortic root (4.1 cm) and the ascending aorta, No significant valve disease   Neuro/Psych    GI/Hepatic GERD  Medicated and Controlled,  Endo/Other  obese  Renal/GU Renal InsufficiencyRenal disease (creat 1.63)     Musculoskeletal  (+) Arthritis ,   Abdominal (+) + obese,   Peds  Hematology   Anesthesia Other Findings   Reproductive/Obstetrics                            Anesthesia Physical Anesthesia Plan  ASA: III  Anesthesia Plan: General   Post-op Pain Management:    Induction: Intravenous  PONV Risk Score and Plan: 3 and Ondansetron, Dexamethasone, Treatment may vary due to age or medical condition and Scopolamine patch - Pre-op  Airway Management Planned: Oral ETT  Additional Equipment:   Intra-op Plan:   Post-operative Plan: Extubation in OR  Informed Consent: I have reviewed the patients History and Physical, chart, labs and discussed the procedure including the risks, benefits and alternatives for the proposed anesthesia with the patient or authorized representative who has  indicated his/her understanding and acceptance.       Plan Discussed with: CRNA and Surgeon  Anesthesia Plan Comments: (Recent admission 2/14-2/16/21 for CHF exacerbation. He did test positive for COVID on admission however he had no respiratory symptoms and CXR and CTA were clear - no specific treatment for COVID was initiated. Cardiology consulted. He improved with careful diuresis and was discharged for followup with primary cardiologist Dr. Otho Perl at [facility].  Follows with cardiology at [facility] for hx of of CAD s/p anterior MI 2013 treated with stent to LAD, HFrEF last EF measured was 30 to 35% by echo 02/06/19 status post Biotronik AICD placement, hypertension. Last seen 07/23/19 and cleared for surgery at that time. Per note, "Pre-operative cardiovascular examination, inplantable cardioverter-defibrillator in place: He is stable and a relatively low surgical risk. No anginal CP. HF when in the hospital was transient and related to the overall situation - subsequent response to the HF overshot and resulted in dehydration. RHYTHM: NO significant symptoms of palpitations, pre-syncope, syncope or weakness. Normal Single Chamber ICD Function. Rhythms Clinically Stable"  Device orders faced by PAT RN. Device rep notified.   Stable CKD, creatinine 1.63 on preop labs. Recent baseline appears to be ~1.70.  Remainder of preop labs unremarkable.   EKG 06/14/19 (read per Dr. Elmarie Shiley consult note 06/15/19): EKG showed normal sinus rhythm, right bundle branch block, downsloping ST segment with T wave inversion in the lateral leads.  Review of Care Everywhere shows EKG 06/05/19 also noted RBBB and lateral infarct. Per notes, not significantly changed from prior tracing  01/28/19.  CT Myelogram Cervical spine 06/04/19 (care everywhere): IMPRESSION: Spondylosis worst at C3-4 where there is a large right paracentral protrusion markedly indenting the cord. Reversal of lordosis contributes to stenosis at this  level.  Status post solid C4-5 ACDF. The central canal and foramina are widely patent.  Moderate to moderately severe bilateral foraminal narrowing at C5-6 appears worse on the left. The central canal is open at C5-6.  Mild-to-moderate bilateral foraminal narrowing at C6-7 is more notable on the left.  Mild bilateral foraminal narrowing C7-T1 and mild right foraminal narrowing C2-3.  TTE 02/06/19 (care everywhere): Summary There is borderline left ventricular hypertrophy. Severely reduced LV systolic function with a large area of distal anterior, distal inferior and apical dyskinesis. LVEF estimated at 30-35% Mild dilation of the aortic root (4.1 cm) and the ascending aorta. No significant valve disease No change compared to echo from 10/2016 )       Anesthesia Quick Evaluation

## 2019-08-07 NOTE — Progress Notes (Signed)
Anesthesia Chart Review:  Recent admission 2/14-2/16/21 for CHF exacerbation. He did test positive for COVID on admission however he had no respiratory symptoms and CXR and CTA were clear - no specific treatment for COVID was initiated. Cardiology consulted. He improved with careful diuresis and was discharged for followup with primary cardiologist Dr. Judithe Modest at [facility].  Follows with cardiology at [facility] for hx of of CAD s/p anterior MI 2013 treated with stent to LAD, HFrEF last EF measured was 30 to 35% by echo 02/06/19 status post Biot[employer]ik AICD placement, hypertension. Last seen 07/23/19 and cleared for surgery at that time. Per note, "Pre-operative cardiovascular examination, inplantable cardioverter-defibrillator in place: He is stable and a relatively low surgical risk. No anginal CP. HF when in the hospital was transient and related to the overall situation - subsequent response to the HF overshot and resulted in dehydration. RHYTHM: NO significant symptoms of palpitations, pre-syncope, syncope or weakness. Normal Single Chamber ICD Function. Rhythms Clinically Stable"  Device orders faced by PAT RN. Device rep notified.   Stable CKD, creatinine 1.63 on preop labs. Recent baseline appears to be ~1.70.  Remainder of preop labs unremarkable.   EKG 06/14/19 (read per Dr. Harvie Bridge consult note 06/15/19): EKG showed normal sinus rhythm, right bundle branch block, downsloping ST segment with T wave inversion in the lateral leads.  Review of Care Everywhere shows EKG 06/05/19 also noted RBBB and lateral infarct. Per notes, not significantly changed from prior tracing 01/28/19.  CT Myelogram Cervical spine 06/04/19 (care everywhere): IMPRESSION: Spondylosis worst at C3-4 where there is a large right paracentral protrusion markedly indenting the cord. Reversal of lordosis contributes to stenosis at this level.  Status post solid C4-5 ACDF. The central canal and foramina are widely patent.  Moderate to  moderately severe bilateral foraminal narrowing at C5-6 appears worse on the left. The central canal is open at C5-6.  Mild-to-moderate bilateral foraminal narrowing at C6-7 is more notable on the left.  Mild bilateral foraminal narrowing C7-T1 and mild right foraminal narrowing C2-3.  TTE 02/06/19 (care everywhere): Summary There is borderline left ventricular hypertrophy. Severely reduced LV systolic function with a large area of distal anterior, distal inferior and apical dyskinesis. LVEF estimated at 30-35% Mild dilation of the aortic root (4.1 cm) and the ascending aorta. No significant valve disease No change compared to echo from 10/2016   [facility] Short Stay Center/Anesthesiology Phone [phone] 08/07/2019 9:34 AM

## 2019-08-10 ENCOUNTER — Ambulatory Visit (HOSPITAL_COMMUNITY): Payer: Medicare Other

## 2019-08-10 ENCOUNTER — Other Ambulatory Visit: Payer: Self-pay

## 2019-08-10 ENCOUNTER — Ambulatory Visit (HOSPITAL_COMMUNITY): Payer: Medicare Other | Admitting: Certified Registered"

## 2019-08-10 ENCOUNTER — Encounter (HOSPITAL_COMMUNITY)
Admission: RE | Disposition: A | Discharge status: Home / Self Care | Payer: Self-pay | Source: Home / Self Care | Attending: Neurosurgery

## 2019-08-10 ENCOUNTER — Ambulatory Visit (HOSPITAL_COMMUNITY): Payer: Medicare Other | Admitting: Physician Assistant

## 2019-08-10 ENCOUNTER — Encounter (HOSPITAL_COMMUNITY): Payer: Self-pay | Admitting: Neurosurgery

## 2019-08-10 ENCOUNTER — Inpatient Hospital Stay (HOSPITAL_COMMUNITY)
Admission: RE | Admit: 2019-08-10 | Discharge: 2019-08-11 | DRG: 472 | Disposition: A | Discharge status: Home / Self Care | Payer: Medicare Other | Attending: Neurosurgery | Admitting: Neurosurgery

## 2019-08-10 DIAGNOSIS — E785 Hyperlipidemia, unspecified: Secondary | ICD-10-CM | POA: Diagnosis present

## 2019-08-10 DIAGNOSIS — I252 Old myocardial infarction: Secondary | ICD-10-CM

## 2019-08-10 DIAGNOSIS — E669 Obesity, unspecified: Secondary | ICD-10-CM | POA: Diagnosis present

## 2019-08-10 DIAGNOSIS — Z79899 Other long term (current) drug therapy: Secondary | ICD-10-CM

## 2019-08-10 DIAGNOSIS — I255 Ischemic cardiomyopathy: Secondary | ICD-10-CM | POA: Diagnosis present

## 2019-08-10 DIAGNOSIS — I5022 Chronic systolic (congestive) heart failure: Secondary | ICD-10-CM | POA: Diagnosis not present

## 2019-08-10 DIAGNOSIS — M4802 Spinal stenosis, cervical region: Secondary | ICD-10-CM | POA: Diagnosis present

## 2019-08-10 DIAGNOSIS — Z6831 Body mass index (BMI) 31.0-31.9, adult: Secondary | ICD-10-CM

## 2019-08-10 DIAGNOSIS — M4712 Other spondylosis with myelopathy, cervical region: Secondary | ICD-10-CM | POA: Diagnosis present

## 2019-08-10 DIAGNOSIS — I110 Hypertensive heart disease with heart failure: Secondary | ICD-10-CM | POA: Diagnosis present

## 2019-08-10 DIAGNOSIS — Z950 Presence of cardiac pacemaker: Secondary | ICD-10-CM

## 2019-08-10 DIAGNOSIS — Z87891 Personal history of nicotine dependence: Secondary | ICD-10-CM

## 2019-08-10 DIAGNOSIS — M4722 Other spondylosis with radiculopathy, cervical region: Secondary | ICD-10-CM | POA: Diagnosis present

## 2019-08-10 DIAGNOSIS — Z7982 Long term (current) use of aspirin: Secondary | ICD-10-CM

## 2019-08-10 DIAGNOSIS — Z8616 Personal history of COVID-19: Secondary | ICD-10-CM

## 2019-08-10 DIAGNOSIS — Z9861 Coronary angioplasty status: Secondary | ICD-10-CM | POA: Diagnosis not present

## 2019-08-10 DIAGNOSIS — G8929 Other chronic pain: Secondary | ICD-10-CM | POA: Diagnosis present

## 2019-08-10 DIAGNOSIS — I2510 Atherosclerotic heart disease of native coronary artery without angina pectoris: Secondary | ICD-10-CM | POA: Diagnosis present

## 2019-08-10 DIAGNOSIS — K219 Gastro-esophageal reflux disease without esophagitis: Secondary | ICD-10-CM | POA: Diagnosis present

## 2019-08-10 DIAGNOSIS — Z981 Arthrodesis status: Secondary | ICD-10-CM

## 2019-08-10 DIAGNOSIS — Z888 Allergy status to other drugs, medicaments and biological substances status: Secondary | ICD-10-CM

## 2019-08-10 DIAGNOSIS — Z419 Encounter for procedure for purposes other than remedying health state, unspecified: Secondary | ICD-10-CM

## 2019-08-10 HISTORY — PX: ANTERIOR CERVICAL DECOMP/DISCECTOMY FUSION: SHX1161

## 2019-08-10 SURGERY — ANTERIOR CERVICAL DECOMPRESSION/DISCECTOMY FUSION 2 LEVELS
Anesthesia: General | Site: Spine Cervical

## 2019-08-10 MED ORDER — PROPOFOL 10 MG/ML IV BOLUS
INTRAVENOUS | Status: AC
  Filled 2019-08-10: qty 20

## 2019-08-10 MED ORDER — DOCUSATE SODIUM 100 MG PO CAPS
100.0000 mg | ORAL_CAPSULE | Freq: Two times a day (BID) | ORAL | Status: DC
  Administered 2019-08-10: 22:00:00 100 mg via ORAL
  Filled 2019-08-10: qty 1

## 2019-08-10 MED ORDER — BUPROPION HCL ER (SR) 150 MG PO TB12
150.0000 mg | ORAL_TABLET | Freq: Two times a day (BID) | ORAL | Status: DC
  Administered 2019-08-10: 22:00:00 150 mg via ORAL
  Filled 2019-08-10: qty 1

## 2019-08-10 MED ORDER — OXYCODONE HCL 5 MG PO TABS: 5.0000 mg | ORAL_TABLET | ORAL | Status: DC | PRN

## 2019-08-10 MED ORDER — DEXAMETHASONE SODIUM PHOSPHATE 4 MG/ML IJ SOLN
4.0000 mg | Freq: Four times a day (QID) | INTRAMUSCULAR | Status: AC
  Administered 2019-08-11: 4 mg via INTRAVENOUS
  Filled 2019-08-10: qty 1

## 2019-08-10 MED ORDER — LIDOCAINE 2% (20 MG/ML) 5 ML SYRINGE
INTRAMUSCULAR | Status: DC | PRN
  Administered 2019-08-10: 30 mg via INTRAVENOUS

## 2019-08-10 MED ORDER — SODIUM CHLORIDE 0.9 % IV SOLN
INTRAVENOUS | Status: DC | PRN
  Administered 2019-08-10: 11:00:00 500 mL

## 2019-08-10 MED ORDER — LISINOPRIL 5 MG PO TABS
5.0000 mg | ORAL_TABLET | Freq: Every day | ORAL | Status: DC
  Administered 2019-08-10: 17:00:00 5 mg via ORAL
  Filled 2019-08-10: qty 1

## 2019-08-10 MED ORDER — MIDAZOLAM HCL 5 MG/5ML IJ SOLN
INTRAMUSCULAR | Status: DC | PRN
  Administered 2019-08-10: 2 mg via INTRAVENOUS

## 2019-08-10 MED ORDER — CEFAZOLIN SODIUM-DEXTROSE 2-4 GM/100ML-% IV SOLN
2.0000 g | Freq: Three times a day (TID) | INTRAVENOUS | Status: AC
  Administered 2019-08-10 – 2019-08-11 (×2): 2 g via INTRAVENOUS
  Filled 2019-08-10 (×2): qty 100

## 2019-08-10 MED ORDER — ONDANSETRON HCL 4 MG PO TABS: 4.0000 mg | ORAL_TABLET | Freq: Four times a day (QID) | ORAL | Status: DC | PRN

## 2019-08-10 MED ORDER — ONDANSETRON HCL 4 MG/2ML IJ SOLN: 4.0000 mg | Freq: Four times a day (QID) | INTRAMUSCULAR | Status: DC | PRN

## 2019-08-10 MED ORDER — LIDOCAINE-EPINEPHRINE 1 %-1:100000 IJ SOLN
INTRAMUSCULAR | Status: AC
  Filled 2019-08-10: qty 1

## 2019-08-10 MED ORDER — MEPERIDINE HCL 25 MG/ML IJ SOLN: 6.2500 mg | INTRAMUSCULAR | Status: DC | PRN

## 2019-08-10 MED ORDER — BACITRACIN ZINC 500 UNIT/GM EX OINT
TOPICAL_OINTMENT | CUTANEOUS | Status: DC | PRN
  Administered 2019-08-10: 1 via TOPICAL

## 2019-08-10 MED ORDER — LIDOCAINE 2% (20 MG/ML) 5 ML SYRINGE
INTRAMUSCULAR | Status: AC
  Filled 2019-08-10: qty 5

## 2019-08-10 MED ORDER — BACITRACIN ZINC 500 UNIT/GM EX OINT
TOPICAL_OINTMENT | CUTANEOUS | Status: AC
  Filled 2019-08-10: qty 28.35

## 2019-08-10 MED ORDER — ACETAMINOPHEN 500 MG PO TABS
1000.0000 mg | ORAL_TABLET | Freq: Four times a day (QID) | ORAL | Status: DC
  Administered 2019-08-10 – 2019-08-11 (×3): 1000 mg via ORAL
  Filled 2019-08-10 (×3): qty 2

## 2019-08-10 MED ORDER — SPIRONOLACTONE 25 MG PO TABS
25.0000 mg | ORAL_TABLET | Freq: Every day | ORAL | Status: DC
  Administered 2019-08-10: 16:00:00 25 mg via ORAL
  Filled 2019-08-10: qty 1

## 2019-08-10 MED ORDER — AMIODARONE HCL 200 MG PO TABS: 100.0000 mg | ORAL_TABLET | Freq: Every day | ORAL | Status: DC

## 2019-08-10 MED ORDER — LACTATED RINGERS IV SOLN: INTRAVENOUS | Status: DC

## 2019-08-10 MED ORDER — 0.9 % SODIUM CHLORIDE (POUR BTL) OPTIME
TOPICAL | Status: DC | PRN
  Administered 2019-08-10: 1000 mL

## 2019-08-10 MED ORDER — EZETIMIBE 10 MG PO TABS
10.0000 mg | ORAL_TABLET | Freq: Every day | ORAL | Status: DC
  Administered 2019-08-10: 17:00:00 10 mg via ORAL
  Filled 2019-08-10: qty 1

## 2019-08-10 MED ORDER — MORPHINE SULFATE (PF) 4 MG/ML IV SOLN
4.0000 mg | INTRAVENOUS | Status: DC | PRN
  Administered 2019-08-10 – 2019-08-11 (×4): 4 mg via INTRAVENOUS
  Filled 2019-08-10 (×4): qty 1

## 2019-08-10 MED ORDER — SUGAMMADEX SODIUM 200 MG/2ML IV SOLN
INTRAVENOUS | Status: DC | PRN
  Administered 2019-08-10: 220 mg via INTRAVENOUS

## 2019-08-10 MED ORDER — PROPOFOL 10 MG/ML IV BOLUS
INTRAVENOUS | Status: DC | PRN
  Administered 2019-08-10: 150 mg via INTRAVENOUS

## 2019-08-10 MED ORDER — THROMBIN 5000 UNITS EX SOLR
CUTANEOUS | Status: AC
  Filled 2019-08-10: qty 5000

## 2019-08-10 MED ORDER — LIDOCAINE-EPINEPHRINE 1 %-1:100000 IJ SOLN
INTRAMUSCULAR | Status: DC | PRN
  Administered 2019-08-10: 10 mL

## 2019-08-10 MED ORDER — MENTHOL 3 MG MT LOZG: 1.0000 | LOZENGE | OROMUCOSAL | Status: DC | PRN

## 2019-08-10 MED ORDER — DEXAMETHASONE 4 MG PO TABS
4.0000 mg | ORAL_TABLET | Freq: Four times a day (QID) | ORAL | Status: AC
  Administered 2019-08-10: 17:00:00 4 mg via ORAL
  Filled 2019-08-10: qty 1

## 2019-08-10 MED ORDER — CHLORHEXIDINE GLUCONATE CLOTH 2 % EX PADS: 6.0000 | MEDICATED_PAD | Freq: Once | CUTANEOUS | Status: DC

## 2019-08-10 MED ORDER — CARVEDILOL 12.5 MG PO TABS
12.5000 mg | ORAL_TABLET | Freq: Two times a day (BID) | ORAL | Status: DC
  Administered 2019-08-10 – 2019-08-11 (×2): 12.5 mg via ORAL
  Filled 2019-08-10 (×2): qty 1

## 2019-08-10 MED ORDER — ALUM & MAG HYDROXIDE-SIMETH 200-200-20 MG/5ML PO SUSP: 30.0000 mL | Freq: Four times a day (QID) | ORAL | Status: DC | PRN

## 2019-08-10 MED ORDER — EPHEDRINE SULFATE-NACL 50-0.9 MG/10ML-% IV SOSY
PREFILLED_SYRINGE | INTRAVENOUS | Status: DC | PRN
  Administered 2019-08-10 (×2): 10 mg via INTRAVENOUS
  Administered 2019-08-10: 15 mg via INTRAVENOUS
  Administered 2019-08-10: 10 mg via INTRAVENOUS
  Administered 2019-08-10: 15 mg via INTRAVENOUS

## 2019-08-10 MED ORDER — MIDAZOLAM HCL 2 MG/2ML IJ SOLN
INTRAMUSCULAR | Status: AC
  Filled 2019-08-10: qty 2

## 2019-08-10 MED ORDER — OXYCODONE HCL 5 MG PO TABS
10.0000 mg | ORAL_TABLET | ORAL | Status: DC | PRN
  Administered 2019-08-10 – 2019-08-11 (×4): 10 mg via ORAL
  Filled 2019-08-10 (×4): qty 2

## 2019-08-10 MED ORDER — MIDAZOLAM HCL 2 MG/2ML IJ SOLN: 0.5000 mg | Freq: Once | INTRAMUSCULAR | Status: DC | PRN

## 2019-08-10 MED ORDER — ATORVASTATIN CALCIUM 80 MG PO TABS
80.0000 mg | ORAL_TABLET | Freq: Every day | ORAL | Status: DC
  Administered 2019-08-10: 16:00:00 80 mg via ORAL
  Filled 2019-08-10: qty 1

## 2019-08-10 MED ORDER — FENTANYL CITRATE (PF) 100 MCG/2ML IJ SOLN
INTRAMUSCULAR | Status: DC | PRN
  Administered 2019-08-10: 250 ug via INTRAVENOUS
  Administered 2019-08-10: 50 ug via INTRAVENOUS

## 2019-08-10 MED ORDER — PHENOL 1.4 % MT LIQD
1.0000 | OROMUCOSAL | Status: DC | PRN
  Administered 2019-08-10: 18:00:00 1 via OROMUCOSAL
  Filled 2019-08-10: qty 177

## 2019-08-10 MED ORDER — OXYCODONE-ACETAMINOPHEN 10-325 MG PO TABS: 1.0000 | ORAL_TABLET | ORAL | Status: DC | PRN

## 2019-08-10 MED ORDER — FAMOTIDINE 20 MG PO TABS
40.0000 mg | ORAL_TABLET | Freq: Every day | ORAL | Status: DC
  Administered 2019-08-10: 22:00:00 40 mg via ORAL
  Filled 2019-08-10: qty 2

## 2019-08-10 MED ORDER — THROMBIN 5000 UNITS EX SOLR
OROMUCOSAL | Status: DC | PRN
  Administered 2019-08-10: 11:00:00 5 mL via TOPICAL

## 2019-08-10 MED ORDER — OXYCODONE-ACETAMINOPHEN 5-325 MG PO TABS: 1.0000 | ORAL_TABLET | ORAL | Status: DC | PRN

## 2019-08-10 MED ORDER — ONDANSETRON HCL 4 MG/2ML IJ SOLN
INTRAMUSCULAR | Status: DC | PRN
  Administered 2019-08-10: 4 mg via INTRAVENOUS

## 2019-08-10 MED ORDER — ACETAMINOPHEN 650 MG RE SUPP: 650.0000 mg | RECTAL | Status: DC | PRN

## 2019-08-10 MED ORDER — ACETAMINOPHEN 325 MG PO TABS: 650.0000 mg | ORAL_TABLET | ORAL | Status: DC | PRN

## 2019-08-10 MED ORDER — PROMETHAZINE HCL 25 MG/ML IJ SOLN: 6.2500 mg | INTRAMUSCULAR | Status: DC | PRN

## 2019-08-10 MED ORDER — BISACODYL 10 MG RE SUPP: 10.0000 mg | Freq: Every day | RECTAL | Status: DC | PRN

## 2019-08-10 MED ORDER — PANTOPRAZOLE SODIUM 40 MG IV SOLR
40.0000 mg | Freq: Every day | INTRAVENOUS | Status: DC
  Administered 2019-08-10: 40 mg via INTRAVENOUS
  Filled 2019-08-10: qty 40

## 2019-08-10 MED ORDER — NITROGLYCERIN 0.4 MG SL SUBL: 0.4000 mg | SUBLINGUAL_TABLET | SUBLINGUAL | Status: DC | PRN

## 2019-08-10 MED ORDER — DEXMEDETOMIDINE HCL IN NACL 200 MCG/50ML IV SOLN
INTRAVENOUS | Status: DC | PRN
  Administered 2019-08-10: 8 ug via INTRAVENOUS
  Administered 2019-08-10: 4 ug via INTRAVENOUS
  Administered 2019-08-10: 8 ug via INTRAVENOUS

## 2019-08-10 MED ORDER — CYCLOBENZAPRINE HCL 10 MG PO TABS
10.0000 mg | ORAL_TABLET | Freq: Three times a day (TID) | ORAL | Status: DC | PRN
  Administered 2019-08-10: 16:00:00 10 mg via ORAL
  Filled 2019-08-10: qty 1

## 2019-08-10 MED ORDER — ROCURONIUM BROMIDE 50 MG/5ML IV SOSY
PREFILLED_SYRINGE | INTRAVENOUS | Status: DC | PRN
  Administered 2019-08-10: 60 mg via INTRAVENOUS
  Administered 2019-08-10: 40 mg via INTRAVENOUS

## 2019-08-10 MED ORDER — FENTANYL CITRATE (PF) 250 MCG/5ML IJ SOLN
INTRAMUSCULAR | Status: AC
  Filled 2019-08-10: qty 5

## 2019-08-10 MED ORDER — HYDROMORPHONE HCL 1 MG/ML IJ SOLN: 0.2500 mg | INTRAMUSCULAR | Status: DC | PRN

## 2019-08-10 SURGICAL SUPPLY — 70 items
BAG DECANTER FOR FLEXI CONT (MISCELLANEOUS) ×3 IMPLANT
BENZOIN TINCTURE PRP APPL 2/3 (GAUZE/BANDAGES/DRESSINGS) ×4 IMPLANT
BIT DRILL NEURO 2X3.1 SFT TUCH (MISCELLANEOUS) ×1 IMPLANT
BLADE SURG 15 STRL LF DISP TIS (BLADE) ×1 IMPLANT
BLADE SURG 15 STRL SS (BLADE) ×2
BLADE ULTRA TIP 2M (BLADE) ×3 IMPLANT
BUR BARREL STRAIGHT FLUTE 4.0 (BURR) ×3 IMPLANT
BUR MATCHSTICK NEURO 3.0 LAGG (BURR) ×3 IMPLANT
CANISTER SUCT 3000ML PPV (MISCELLANEOUS) ×3 IMPLANT
CARTRIDGE OIL MAESTRO DRILL (MISCELLANEOUS) ×1 IMPLANT
CLOSURE WOUND 1/2 X4 (GAUZE/BANDAGES/DRESSINGS) ×1
COVER MAYO STAND STRL (DRAPES) ×3 IMPLANT
COVER WAND RF STERILE (DRAPES) ×1 IMPLANT
DECANTER SPIKE VIAL GLASS SM (MISCELLANEOUS) ×1 IMPLANT
DEVICE FUSION VISTA 14X14X9MM (Spacer) IMPLANT
DIFFUSER DRILL AIR PNEUMATIC (MISCELLANEOUS) ×3 IMPLANT
DRAPE LAPAROTOMY 100X72 PEDS (DRAPES) ×3 IMPLANT
DRAPE MICROSCOPE LEICA (MISCELLANEOUS) IMPLANT
DRAPE SURG 17X23 STRL (DRAPES) ×8 IMPLANT
DRILL NEURO 2X3.1 SOFT TOUCH (MISCELLANEOUS) ×3
DRSG OPSITE POSTOP 4X6 (GAUZE/BANDAGES/DRESSINGS) ×2 IMPLANT
ELECT BLADE 4.0 EZ CLEAN MEGAD (MISCELLANEOUS) ×3
ELECT REM PT RETURN 9FT ADLT (ELECTROSURGICAL) ×3
ELECTRODE BLDE 4.0 EZ CLN MEGD (MISCELLANEOUS) IMPLANT
ELECTRODE REM PT RTRN 9FT ADLT (ELECTROSURGICAL) ×1 IMPLANT
GAUZE 4X4 16PLY RFD (DISPOSABLE) IMPLANT
GAUZE SPONGE 4X4 12PLY STRL (GAUZE/BANDAGES/DRESSINGS) ×1 IMPLANT
GLOVE BIO SURGEON STRL SZ 6.5 (GLOVE) ×1 IMPLANT
GLOVE BIO SURGEON STRL SZ7 (GLOVE) ×2 IMPLANT
GLOVE BIO SURGEON STRL SZ7.5 (GLOVE) ×2 IMPLANT
GLOVE BIO SURGEON STRL SZ8 (GLOVE) ×5 IMPLANT
GLOVE BIO SURGEON STRL SZ8.5 (GLOVE) ×3 IMPLANT
GLOVE BIO SURGEONS STRL SZ 6.5 (GLOVE) ×1
GLOVE BIOGEL PI IND STRL 6.5 (GLOVE) IMPLANT
GLOVE BIOGEL PI IND STRL 7.5 (GLOVE) IMPLANT
GLOVE BIOGEL PI INDICATOR 6.5 (GLOVE) ×2
GLOVE BIOGEL PI INDICATOR 7.5 (GLOVE) ×2
GLOVE EXAM NITRILE XL STR (GLOVE) IMPLANT
GLOVE SURG SS PI 7.5 STRL IVOR (GLOVE) ×12 IMPLANT
GOWN STRL REUS W/ TWL LRG LVL3 (GOWN DISPOSABLE) IMPLANT
GOWN STRL REUS W/ TWL XL LVL3 (GOWN DISPOSABLE) IMPLANT
GOWN STRL REUS W/TWL LRG LVL3 (GOWN DISPOSABLE) ×10
GOWN STRL REUS W/TWL XL LVL3 (GOWN DISPOSABLE) ×2
HEMOSTAT POWDER KIT SURGIFOAM (HEMOSTASIS) ×3 IMPLANT
KIT BASIN OR (CUSTOM PROCEDURE TRAY) ×3 IMPLANT
KIT TURNOVER KIT B (KITS) ×3 IMPLANT
MARKER SKIN DUAL TIP RULER LAB (MISCELLANEOUS) ×3 IMPLANT
NDL SPNL 18GX3.5 QUINCKE PK (NEEDLE) ×1 IMPLANT
NEEDLE HYPO 22GX1.5 SAFETY (NEEDLE) ×3 IMPLANT
NEEDLE SPNL 18GX3.5 QUINCKE PK (NEEDLE) ×3 IMPLANT
NS IRRIG 1000ML POUR BTL (IV SOLUTION) ×3 IMPLANT
OIL CARTRIDGE MAESTRO DRILL (MISCELLANEOUS) ×3
PACK LAMINECTOMY NEURO (CUSTOM PROCEDURE TRAY) ×3 IMPLANT
PATTIES SURGICAL 1X1 (DISPOSABLE) ×2 IMPLANT
PEEK VISTA 14X14X8MM (Peek) ×2 IMPLANT
PIN DISTRACTION 14MM (PIN) ×6 IMPLANT
PLATE ANT CERV XTEND ELD 1 L14 (Plate) ×4 IMPLANT
PUTTY DBM 2CC CALC GRAN (Putty) ×2 IMPLANT
RUBBERBAND STERILE (MISCELLANEOUS) IMPLANT
SCREW XTD VAR 4.2 SELF TAP (Screw) ×16 IMPLANT
SPONGE INTESTINAL PEANUT (DISPOSABLE) ×4 IMPLANT
SPONGE SURGIFOAM ABS GEL SZ50 (HEMOSTASIS) IMPLANT
STRIP CLOSURE SKIN 1/2X4 (GAUZE/BANDAGES/DRESSINGS) ×2 IMPLANT
SUT VIC AB 0 CT1 27 (SUTURE) ×2
SUT VIC AB 0 CT1 27XBRD ANTBC (SUTURE) ×1 IMPLANT
SUT VIC AB 3-0 SH 8-18 (SUTURE) ×5 IMPLANT
TOWEL GREEN STERILE (TOWEL DISPOSABLE) ×3 IMPLANT
TOWEL GREEN STERILE FF (TOWEL DISPOSABLE) ×3 IMPLANT
VISTA 14X14X9MM (Spacer) ×3 IMPLANT
WATER STERILE IRR 1000ML POUR (IV SOLUTION) ×3 IMPLANT

## 2019-08-10 NOTE — H&P (Signed)
Subjective: The patient is a 59 year old white male who has had a previous anterior cervical discectomy and fusion by another physician many years ago.  He has developed recurrent neck and arm pain, numbness, tingling, weakness, etc.  He was worked up with a cervical MRI which demonstrated spondylosis and stenosis at C3-4 and C5-6.  I discussed the various treatment options with him.  He has decided to proceed with surgery.  The patient has a cardiac history and has obtained preoperative cardiac clearance from his cardiologist.  Past Medical History:  Diagnosis Date  . Arthritis 2019  . CAD (coronary artery disease)   . CHF (congestive heart failure) ([place])   . Chronic pain   . Chronic systolic heart failure ([place])   . GERD (gastroesophageal reflux disease)   . History of kidney stones   . Hyperlipidemia   . Hypertension   . Ischemic cardiomyopathy   . Myocardial infarction ([place])   . Pacemaker     Past Surgical History:  Procedure Laterality Date  . CARDIAC CATHETERIZATION    . CORONARY ANGIOPLASTY    . INSERT / REPLACE / REMOVE PACEMAKER    . neck fusion      Allergies  Allergen Reactions  . Prednisone Other (See Comments)    Significant increase in violent temper,which led to an arrest for disorderly conduct.    Social History   Tobacco Use  . Smoking status: Former Research scientist (life sciences)  . Smokeless tobacco: Never Used  . Tobacco comment: quit in 2013  Substance Use Topics  . Alcohol use: Never    Family History  Problem Relation Age of Onset  . CAD Father    Prior to Admission medications   Medication Sig Start Date End Date Taking? Authorizing Provider  acetaminophen (TYLENOL) 325 MG tablet Take 2 tablets (650 mg total) by mouth every 6 (six) hours as needed for mild pain (or Fever >/= 101). 06/16/19  Yes Elgergawy, Silver Huguenin, MD  amiodarone (PACERONE) 200 MG tablet Take 100 mg by mouth daily.    Yes [provider]  atorvastatin (LIPITOR) 80 MG tablet Take 80 mg by mouth  daily.    Yes [provider]  buPROPion (WELLBUTRIN SR) 150 MG 12 hr tablet Take 150 mg by mouth 2 (two) times daily.   Yes [provider]  carvedilol (COREG) 12.5 MG tablet Take 12.5 mg by mouth in the morning and at bedtime.    Yes [provider]  ezetimibe (ZETIA) 10 MG tablet Take 10 mg by mouth daily.   Yes [provider]  famotidine (PEPCID) 40 MG tablet Take 40 mg by mouth at bedtime.   Yes [provider]  furosemide (LASIX) 40 MG tablet Take 1 tablet (40 mg total) by mouth daily. Patient taking differently: Take 20 mg by mouth daily as needed for fluid.  06/17/19  Yes Elgergawy, Silver Huguenin, MD  lisinopril (ZESTRIL) 5 MG tablet Take 1 tablet (5 mg total) by mouth in the morning and at bedtime. Resume in 1 week Patient taking differently: Take 5 mg by mouth in the morning and at bedtime.  06/23/19  Yes Elgergawy, Silver Huguenin, MD  oxyCODONE-acetaminophen (PERCOCET) 10-325 MG tablet Take 1 tablet by mouth every 6 (six) hours as needed for pain. 06/11/19  Yes [provider]  spironolactone (ALDACTONE) 25 MG tablet Take 25 mg by mouth daily. 05/02/19  Yes [provider]  aspirin EC 81 MG EC tablet Take 1 tablet (81 mg total) by mouth daily. Patient  not taking: Reported on 07/29/2019 06/17/19   Elgergawy, Leana Roe, MD  dexamethasone (DECADRON) 6 MG tablet Take 1 tablet (6 mg total) by mouth daily. Patient not taking: Reported on 07/29/2019 06/16/19   Elgergawy, Leana Roe, MD  nitroGLYCERIN (NITROSTAT) 0.4 MG SL tablet Place 0.4 mg under the tongue every 5 (five) minutes as needed for chest pain.     [provider]     Review of Systems  Positive ROS: As above  All other systems have been reviewed and were otherwise negative with the exception of those mentioned in the HPI and as above.  Objective: Vital signs in last 24 hours: Temp:  [97.6 F (36.4 C)] 97.6 F (36.4 C) (04/12 0854) Pulse Rate:  [64] 64 (04/12 0854) Resp:   [20] 20 (04/12 0854) BP: (106-155)/(64-93) 106/64 (04/12 0944) SpO2:  [99 %] 99 % (04/12 0854) Weight:  [323 kg] 107 kg (04/12 0854) Estimated body mass index is 31.99 kg/m as calculated from the following:   Height as of this encounter: 6' (1.829 m).   Weight as of this encounter: 107 kg.   General Appearance: Alert Head: Normocephalic, without obvious abnormality, atraumatic Eyes: PERRL, conjunctiva/corneas clear, EOM's intact,    Ears: Normal  Throat: Normal  Neck: His right anterior cervical incision is well-healed.  He has limited cervical range of motion. Back: unremarkable Lungs: Clear to auscultation bilaterally, respirations unlabored Heart: Regular rate and rhythm, no murmur, rub or gallop Abdomen: Soft, non-tender Extremities: Extremities normal, atraumatic, no cyanosis or edema Skin: unremarkable  NEUROLOGIC:   Mental status: alert and oriented,Motor Exam - grossly normal Sensory Exam - grossly normal Reflexes:  Coordination - grossly normal Gait - grossly normal Balance - grossly normal Cranial Nerves: I: smell Not tested  II: visual acuity  OS: Normal  OD: Normal   II: visual fields Full to confrontation  II: pupils Equal, round, reactive to light  III,VII: ptosis None  III,IV,VI: extraocular muscles  Full ROM  V: mastication Normal  V: facial light touch sensation  Normal  V,VII: corneal reflex  Present  VII: facial muscle function - upper  Normal  VII: facial muscle function - lower Normal  VIII: hearing Not tested  IX: soft palate elevation  Normal  IX,X: gag reflex Present  XI: trapezius strength  5/5  XI: sternocleidomastoid strength 5/5  XI: neck flexion strength  5/5  XII: tongue strength  Normal    Data Review Lab Results  Component Value Date   WBC 8.3 08/06/2019   HGB 13.2 08/06/2019   HCT 40.3 08/06/2019   MCV 91.0 08/06/2019   PLT 189 08/06/2019   Lab Results  Component Value Date   NA 139 08/06/2019   K 4.8 08/06/2019   CL  105 08/06/2019   CO2 23 08/06/2019   BUN 29 (H) 08/06/2019   CREATININE 1.63 (H) 08/06/2019   GLUCOSE 115 (H) 08/06/2019   No results found for: INR, PROTIME  Assessment/Plan: 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  I have reviewed his imaging studies with him and pointed out the abnormalities.  We have discussed the various treatment options including surgery.  I have described the surgical treatment option of a C3-4 and C5-6 anterior cervical discectomy, fusion and plating.  I have shown him surgical models.  I have given him a surgical pamphlet.  We have discussed the risk, benefits, alternatives, expected postoperative course, and likelihood of achieving our  goals with surgery.  I have answered all his questions.  He has decided to proceed with surgery.   Cristi Loron 08/10/2019 10:57 AM

## 2019-08-10 NOTE — Progress Notes (Signed)
Subjective: The patient is alert and pleasant.  He looks well.  He is in no apparent distress.  Objective: Vital signs in last 24 hours: Temp:  [97.6 F (36.4 C)-98 F (36.7 C)] 98 F (36.7 C) (04/12 1445) Pulse Rate:  [61-64] 61 (04/12 1445) Resp:  [12-20] 12 (04/12 1445) BP: (106-155)/(64-93) 109/72 (04/12 1430) SpO2:  [96 %-99 %] 96 % (04/12 1445) Weight:  [618 kg] 107 kg (04/12 0854) Estimated body mass index is 31.99 kg/m as calculated from the following:   Height as of this encounter: 6' (1.829 m).   Weight as of this encounter: 107 kg.   Intake/Output from previous day: No intake/output data recorded. Intake/Output this shift: Total I/O In: 1000 [I.V.:1000] Out: 300 [Blood:300]  Physical exam the patient is alert and pleasant.  He is moving all 4 extremities well.  His dressing is clean and dry.  There is no hematoma or shift.  Lab Results: No results for input(s): WBC, HGB, HCT, PLT in the last 72 hours. BMET No results for input(s): NA, K, CL, CO2, GLUCOSE, BUN, CREATININE, CALCIUM in the last 72 hours.  Studies/Results: No results found.  Assessment/Plan: The patient is doing well.  I spoke with his wife.  LOS: 0 days     Cristi Loron 08/10/2019, 2:50 PM

## 2019-08-10 NOTE — Anesthesia Postprocedure Evaluation (Signed)
Anesthesia Post Note  Patient: Jason Thomas  Procedure(s) Performed: ANTERIOR CERVICAL DECOMPRESSION/DISCECTOMY FUSION, INTERBODY PROSTHESIS, PLATE/SCREWS CERVICAL THREE- CERVICAL FOUR, CERVICAL FIVE CERVICAL SIX (N/A Spine Cervical)     Patient location during evaluation: PACU Anesthesia Type: General Level of consciousness: awake and alert, oriented and patient cooperative Pain management: pain level controlled Vital Signs Assessment: post-procedure vital signs reviewed and stable Respiratory status: spontaneous breathing, nonlabored ventilation and respiratory function stable Cardiovascular status: blood pressure returned to baseline and stable Postop Assessment: no apparent nausea or vomiting Anesthetic complications: no    Last Vitals:  Vitals:   08/10/19 1445 08/10/19 1521  BP:  110/80  Pulse: 61 68  Resp: 12 18  Temp: 36.7 C 36.4 C  SpO2: 96% 97%    Last Pain:  Vitals:   08/10/19 1521  TempSrc: Oral  PainSc:                  Cashel Bellina,E. Saide Lanuza

## 2019-08-10 NOTE — Progress Notes (Signed)
No ICD orders received. Dr. Jean Rosenthal and Kipp Brood, [employer] rep at bedside. ICD turned off, Pads placed on patient, patient on monitor.

## 2019-08-10 NOTE — Transfer of Care (Signed)
Immediate Anesthesia Transfer of Care Note  Patient: Jason Thomas  Procedure(s) Performed: ANTERIOR CERVICAL DECOMPRESSION/DISCECTOMY FUSION, INTERBODY PROSTHESIS, PLATE/SCREWS CERVICAL THREE- CERVICAL FOUR, CERVICAL FIVE CERVICAL SIX (N/A Spine Cervical)  Patient Location: PACU  Anesthesia Type:General  Level of Consciousness: drowsy and patient cooperative  Airway & Oxygen Therapy: Patient Spontanous Breathing  Post-op Assessment: Report given to RN and Post -op Vital signs reviewed and stable  Post vital signs: Reviewed and stable  Last Vitals:  Vitals Value Taken Time  BP    Temp    Pulse    Resp    SpO2      Last Pain:  Vitals:   08/10/19 0916  PainSc: 7       Patients Stated Pain Goal: 5 (08/10/19 0916)  Complications: No apparent anesthesia complications

## 2019-08-10 NOTE — Anesthesia Procedure Notes (Signed)
Procedure Name: Intubation Date/Time: 08/10/2019 11:23 AM Performed by: Rosiland Oz, CRNA Pre-anesthesia Checklist: Patient identified, Emergency Drugs available, Suction available, Patient being monitored and Timeout performed Patient Re-evaluated:Patient Re-evaluated prior to induction Oxygen Delivery Method: Circle system utilized Preoxygenation: Pre-oxygenation with 100% oxygen Induction Type: IV induction Ventilation: Mask ventilation without difficulty and Oral airway inserted - appropriate to patient size Laryngoscope Size: Glidescope and 4 Grade View: Grade I Tube type: Oral Tube size: 7.5 mm Number of attempts: 1 Airway Equipment and Method: Stylet and Video-laryngoscopy Placement Confirmation: ETT inserted through vocal cords under direct vision,  positive ETCO2 and breath sounds checked- equal and bilateral Secured at: 21 cm Tube secured with: Tape Dental Injury: Teeth and Oropharynx as per pre-operative assessment

## 2019-08-10 NOTE — Op Note (Signed)
Brief history: The patient is a 59 year old white male with a cardiac history who has had a previous C4-5 anterior cervical discectomy and fusion by another physician years ago. He has developed neck pain, arm numbness, tingling, weakness, etc. He was worked up with a cervical myelo CT which demonstrated a large central herniated disks at C3-4 and bilateral spondylosis at C5-6. I discussed the various treatment options with the patient. He has weighed the risks, benefits and alternatives surgery and decided to proceed with a C3-4 and C5-6 anterior cervical discectomy, fusion and plating.  Preoperative diagnosis: C3-4 and C5-6 herniated disc, spondylosis, stenosis, cervicalgia, cervical radiculopathy, cervical myelopathy  Postoperative diagnosis: The same  Procedure: C3-4 and C5-6 anterior cervical discectomy/decompression; C3-4 and C5-6 interbody arthrodesis with local morcellized autograft bone and Zimmer DBM; insertion of interbody prosthesis at C3-4 and C5-6 (Zimmer peek interbody prosthesis); anterior cervical plating from C3-4 and C5-6 with globus titanium plate  Surgeon: Dr. Delma Officer  Asst.: Dr. Ervin Knack and Hildred Priest, NP  Anesthesia: Gen. endotracheal  Estimated blood loss: 100 cc  Drains: None  Complications: None  Description of procedure: The patient was brought to the operating room by the anesthesia team. General endotracheal anesthesia was induced. A roll was placed under the patient's shoulders to keep the neck in the neutral position. The patient's anterior cervical region was then prepared with Betadine scrub and Betadine solution. Sterile drapes were applied.  The area to be incised was then injected with Marcaine with epinephrine solution. I then used a scalpel to make a transverse incision in the patient's right anterior neck, incising through his old surgical scar. I used the Metzenbaum scissors to dissect through scar tissue and to divide the platysmal  muscle and then to dissect medial to the sternocleidomastoid muscle, jugular vein, and carotid artery. I carefully dissected down towards the anterior cervical spine identifying the esophagus and retracting it medially. Then using Kitner swabs to clear soft tissue from the anterior cervical spine. We then inserted a bent spinal needle into the upper exposed intervertebral disc space. We then obtained intraoperative radiographs confirm our location.  I then used electrocautery to detach the medial border of the longus colli muscle bilaterally from the C3-4 and C5-6 intervertebral disc spaces. I then inserted the Caspar self-retaining retractor underneath the longus colli muscle bilaterally to provide exposure.  We then incised the intervertebral disc at C3-4. We then performed a partial intervertebral discectomy with a pituitary forceps and the Karlin curettes. I then inserted distraction screws into the vertebral bodies at C3 and C4. We then distracted the interspace. We then used the high-speed drill to decorticate the vertebral endplates at C3-4, to drill away the remainder of the intervertebral disc, to drill away some posterior spondylosis, and to thin out the posterior longitudinal ligament. I then incised ligament with the arachnoid knife. The posterior longitudinal ligament was adherent to the dura. We then removed the ligament with a Kerrison punches undercutting the vertebral endplates and decompressing the thecal sac. We then performed foraminotomies about the bilateral C4 nerve roots. This completed the decompression at this level.  We then repeat his procedure in analogous fashion at C5-6 decompressing the thecal sac and the bilateral C6 nerve roots.  We now turned our to attention to the interbody fusion. We used the trial spacers to determine the appropriate size for the interbody prosthesis. We then pre-filled prosthesis with a combination of local morcellized autograft bone that we obtained  during decompression as well as Zimmer  DBM. We then inserted the prosthesis into the distracted interspace at C3-4 and C5-6. We then removed the distraction screws. There was a good snug fit of the prosthesis in the interspace.  Having completed the fusion we now turned attention to the anterior spinal instrumentation. We used the high-speed drill to drill away some anterior spondylosis at the disc spaces so that the plate lay down flat. We selected the appropriate length titanium anterior cervical plate. We laid the plates along the anterior aspect of the vertebral bodies from C3-4 and C5-6 i.e. we used 2 separate plates. We then drilled 14 mm holes at C3, C4, C5 and C6. We then secured the plate to the vertebral bodies by placing two 14 mm self-tapping screws at C3, C4, C5 and C6. We then obtained intraoperative radiograph. The demonstrating good position of the instrumentation. We therefore secured the screws the plate the locking each cam. This completed the instrumentation.  We then obtained hemostasis using bipolar electrocautery. We irrigated the wound out with bacitracin solution. We then removed the retractor. We inspected the esophagus for any damage. There was none apparent. We then reapproximated patient's platysmal muscle with interrupted 3-0 Vicryl suture. We then reapproximated the subcutaneous tissue with interrupted 3-0 Vicryl suture. The skin was reapproximated with Steri-Strips and benzoin. The wound was then covered with bacitracin ointment. A sterile dressing was applied. The drapes were removed. Patient was subsequently extubated by the anesthesia team and transported to the post anesthesia care unit in stable condition. All sponge instrument and needle counts were reportedly correct at the end of this case.

## 2019-08-11 DIAGNOSIS — M4712 Other spondylosis with myelopathy, cervical region: Secondary | ICD-10-CM | POA: Diagnosis not present

## 2019-08-11 MED ORDER — OXYCODONE-ACETAMINOPHEN 10-325 MG PO TABS: 1.0000 | ORAL_TABLET | ORAL | 0 refills | Status: DC | PRN

## 2019-08-11 MED ORDER — DOCUSATE SODIUM 100 MG PO CAPS: 100.0000 mg | ORAL_CAPSULE | Freq: Two times a day (BID) | ORAL | 0 refills | Status: DC

## 2019-08-11 NOTE — Progress Notes (Addendum)
0831: Patient's honeycomb dressing changed prior to discharge, per order.  6967: Patient's IV removed, personal belongings packed, and discharge paperwork gone over with patient and wife. Patient escorted to vehicle in wheelchair.

## 2019-08-11 NOTE — Discharge Summary (Signed)
Physician Discharge Summary  Patient ID: Jason Thomas MRN: [id number] DOB/AGE: 05-02-1960 59 y.o.  Admit date: 08/10/2019 Discharge date: 08/11/2019  Admission Diagnoses: Cervical herniated disc, cervical spondylosis, cervical myelopathy, cervical radiculopathy, cervicalgia  Discharge Diagnoses: The same Active Problems:   Cervical spondylosis with myelopathy and radiculopathy   Discharged Condition: good  Hospital Course: I performed a C3-4 and C5-6 anterior cervical discectomy, fusion and plating on the patient on 08/10/2019.  The surgery went well.  On postoperative day #1 the patient requested discharge to home.  He was given written and oral discharge instructions.  All his questions were answered.  Consults: PT, OT, care management Significant Diagnostic Studies: None Treatments: C3-4 and C5-6 anterior cervical discectomy, fusion and plating. Discharge Exam: Blood pressure (!) 128/91, pulse 90, temperature 98.2 F (36.8 C), temperature source Oral, resp. rate 15, height 6' (1.829 m), weight 107 kg, SpO2 98 %. The patient is alert and pleasant.  His strength is grossly normal.  He looks well.  His dressing has a bloodstained.  There is no hematoma or shift.  Disposition: Home  Discharge Instructions    Call MD for:  difficulty breathing, headache or visual disturbances   Complete by: As directed    Call MD for:  extreme fatigue   Complete by: As directed    Call MD for:  hives   Complete by: As directed    Call MD for:  persistant dizziness or light-headedness   Complete by: As directed    Call MD for:  persistant nausea and vomiting   Complete by: As directed    Call MD for:  redness, tenderness, or signs of infection (pain, swelling, redness, odor or green/yellow discharge around incision site)   Complete by: As directed    Call MD for:  severe uncontrolled pain   Complete by: As directed    Call MD for:  temperature >100.4   Complete by: As directed    Diet - low  sodium heart healthy   Complete by: As directed    Discharge instructions   Complete by: As directed    Call [phone] for a followup appointment. Take a stool softener while you are using pain medications.   Driving Restrictions   Complete by: As directed    Do not drive for 2 weeks.   Increase activity slowly   Complete by: As directed    Lifting restrictions   Complete by: As directed    Do not lift more than 5 pounds. No excessive bending or twisting.   May shower / Bathe   Complete by: As directed    Remove the dressing for 3 days after surgery.  You may shower, but leave the incision alone.   Remove dressing in 48 hours   Complete by: As directed    Your stitches are under the scan and will dissolve by themselves. The Steri-Strips will fall off after you take a few showers. Do not rub back or pick at the wound, Leave the wound alone.     Allergies as of 08/11/2019      Reactions   Prednisone Other (See Comments)   Significant increase in violent temper,which led to an arrest for disorderly conduct.      Medication List    STOP taking these medications   acetaminophen 325 MG tablet Commonly known as: TYLENOL   dexamethasone 6 MG tablet Commonly known as: DECADRON     TAKE these medications   amiodarone 200 MG tablet Commonly known as:  PACERONE Take 100 mg by mouth daily.   aspirin 81 MG EC tablet Take 1 tablet (81 mg total) by mouth daily.   atorvastatin 80 MG tablet Commonly known as: LIPITOR Take 80 mg by mouth daily.   buPROPion 150 MG 12 hr tablet Commonly known as: WELLBUTRIN SR Take 150 mg by mouth 2 (two) times daily.   carvedilol 12.5 MG tablet Commonly known as: COREG Take 12.5 mg by mouth in the morning and at bedtime.   docusate sodium 100 MG capsule Commonly known as: COLACE Take 1 capsule (100 mg total) by mouth 2 (two) times daily.   ezetimibe 10 MG tablet Commonly known as: ZETIA Take 10 mg by mouth daily.   famotidine 40 MG  tablet Commonly known as: PEPCID Take 40 mg by mouth at bedtime.   furosemide 40 MG tablet Commonly known as: LASIX Take 1 tablet (40 mg total) by mouth daily. What changed:   how much to take  when to take this  reasons to take this   lisinopril 5 MG tablet Commonly known as: ZESTRIL Take 1 tablet (5 mg total) by mouth in the morning and at bedtime. Resume in 1 week What changed: additional instructions   nitroGLYCERIN 0.4 MG SL tablet Commonly known as: NITROSTAT Place 0.4 mg under the tongue every 5 (five) minutes as needed for chest pain.   oxyCODONE-acetaminophen 10-325 MG tablet Commonly known as: PERCOCET Take 1 tablet by mouth every 4 (four) hours as needed for pain. What changed: when to take this   spironolactone 25 MG tablet Commonly known as: ALDACTONE Take 25 mg by mouth daily.        Signed: Cristi Loron 08/11/2019, 7:38 AM

## 2019-08-11 NOTE — Evaluation (Signed)
Occupational Therapy Evaluation Patient Details Name: Jason Thomas MRN: [id number] DOB: 04/09/1961 Today's Date: 08/11/2019    History of Present Illness Jason Thomas is a 59 year old male with hx of CAD, CHF, pacemaker, previous ACDF. S/P ACDF C3, C4, C5.    Clinical Impression   PTA, pt was living at home with his wife, pt reports he was independent with ADL/IADL and functional mobility without use of AD. Pt currently requires supervision for completion of ADL/IADL to ensure adherence to cervical precautions. He required intermittent vc for proper techniques, pt stated "it's difficult to break old habits". Educated pt on importance of educating wife on precautions and having her assist with supervision to adhere to these precautions. Pt with anticipated d/c this date, anticipate pt will continue to progress toward baseline with wife's assistance. Patient evaluated by Occupational Therapy with no further acute OT needs identified. All education has been completed and the patient has no further questions. See below for any follow-up Occupational Therapy or equipment needs. OT to sign off. Thank you for referral.      Follow Up Recommendations  No OT follow up;Supervision - Intermittent    Equipment Recommendations  None recommended by OT    Recommendations for Other Services       Precautions / Restrictions Precautions Precautions: Fall;Cervical Precaution Booklet Issued: Yes (comment) Precaution Comments: provided cervical handout and verbally reviewed Required Braces or Orthoses: Cervical Brace Cervical Brace: Hard collar Restrictions Weight Bearing Restrictions: No      Mobility Bed Mobility Overal bed mobility: Needs Assistance Bed Mobility: Rolling;Sidelying to Sit Rolling: Supervision Sidelying to sit: Supervision       General bed mobility comments: supervision for proper technique  Transfers Overall transfer level: Modified independent                     Balance Overall balance assessment: Mild deficits observed, not formally tested                                         ADL either performed or assessed with clinical judgement   ADL Overall ADL's : Needs assistance/impaired Eating/Feeding: Set up;Sitting   Grooming: Supervision/safety;Standing   Upper Body Bathing: Supervision/ safety;Sitting   Lower Body Bathing: Supervison/ safety;Sit to/from stand   Upper Body Dressing : Supervision/safety;Sitting Upper Body Dressing Details (indicate cue type and reason): donned, doffed cervical collar Lower Body Dressing: Supervision/safety;Sit to/from stand Lower Body Dressing Details (indicate cue type and reason): able to figure-4  Toilet Transfer: Supervision/safety   Toileting- Clothing Manipulation and Hygiene: Supervision/safety;Sit to/from stand       Functional mobility during ADLs: Supervision/safety General ADL Comments: supervision for safety and cues for adherence to precautions during ADL completion and functional mobiltiy;reviewed hypothetical scenarios to adhere to precautions at home     Vision Baseline Vision/History: Wears glasses Wears Glasses: At all times Patient Visual Report: No change from baseline       Perception     Praxis      Pertinent Vitals/Pain Pain Assessment: 0-10 Pain Score: 6  Pain Location: cervical incision Pain Descriptors / Indicators: Sore;Constant Pain Intervention(s): Limited activity within patient's tolerance;Monitored during session;Repositioned     Hand Dominance Right   Extremity/Trunk Assessment Upper Extremity Assessment Upper Extremity Assessment: RUE deficits/detail;LUE deficits/detail;Overall [facility] for tasks assessed(WFL for cervical precautions) RUE Deficits / Details: reports numbness at baseline from previous nerve damage LUE Deficits /  Details: reports numbness at baseline, otherwise [facility]   Lower Extremity Assessment Lower Extremity Assessment:  Overall WFL for tasks assessed   Cervical / Trunk Assessment Cervical / Trunk Assessment: Other exceptions Cervical / Trunk Exceptions: cervical precautions;no bending, no lifting, no twisting   Communication Communication Communication: No difficulties   Cognition Arousal/Alertness: Awake/alert Behavior During Therapy: WFL for tasks assessed/performed Overall Cognitive Status: Within Functional Limits for tasks assessed                                     General Comments  vss throughout;    Exercises     Shoulder Instructions      Home Living Family/patient expects to be discharged to:: Private residence Living Arrangements: Spouse/significant other Available Help at Discharge: Family;Available 24 hours/day(wife took time off from work) Type of Home: House Home Access: Stairs to enter Technical brewer of Steps: 2 Entrance Stairs-Rails: None Home Layout: Laundry or work area in basement;Able to live on main level with bedroom/bathroom     Bathroom Shower/Tub: Gaffer;Tub/shower unit   Bathroom Toilet: Handicapped height                Prior Functioning/Environment Level of Independence: Independent        Comments: pt is on disability, wife still works, she took time off to assist with taking care of pt        OT Problem List: Decreased knowledge of precautions;Pain      OT Treatment/Interventions:      OT Goals(Current goals can be found in the care plan section) Acute Rehab OT Goals Patient Stated Goal: to go home today OT Goal Formulation: With patient Time For Goal Achievement: 08/25/19 Potential to Achieve Goals: Good  OT Frequency:     Barriers to D/C:            Co-evaluation              AM-PAC OT "6 Clicks" Daily Activity     Outcome Measure Help from another person eating meals?: A Little Help from another person taking care of personal grooming?: A Little Help from another person toileting, which  includes using toliet, bedpan, or urinal?: A Little Help from another person bathing (including washing, rinsing, drying)?: A Little Help from another person to put on and taking off regular upper body clothing?: A Little Help from another person to put on and taking off regular lower body clothing?: A Little 6 Click Score: 18   End of Session Equipment Utilized During Treatment: Cervical collar Nurse Communication: Mobility status  Activity Tolerance: Patient tolerated treatment well Patient left: in chair;with call bell/phone within reach  OT Visit Diagnosis: Other abnormalities of gait and mobility (R26.89);Pain Pain - part of body: (neck)                Time: 9485-4627 OT Time Calculation (min): 16 min Charges:  OT General Charges $OT Visit: 1 Visit OT Evaluation $OT Eval Moderate Complexity: [place] OTR/L Acute Rehabilitation Services Office: [place] 08/11/2019, 9:43 AM

## 2019-08-12 ENCOUNTER — Encounter: Payer: Self-pay | Admitting: *Deleted

## 2020-08-10 NOTE — Patient Instructions (Signed)
DUE TO COVID-19 ONLY ONE VISITOR IS ALLOWED TO COME WITH YOU AND STAY IN THE WAITING ROOM ONLY DURING PRE OP AND PROCEDURE DAY OF SURGERY.   TWO VISITOR  MAY VISIT WITH YOU AFTER SURGERY IN YOUR PRIVATE ROOM DURING VISITING HOURS ONLY!  YOU NEED TO HAVE A COVID 19 TEST ON__4-27_____ @_______ , THIS TEST MUST BE DONE BEFORE SURGERY,  COVID TESTING SITE [address] [place] , IT IS ON THE RIGHT GOING OUT [address] APPROXIMATELY  2 MINUTES PAST [employer] ON THE RIGHT. ONCE YOUR COVID TEST IS COMPLETED,  PLEASE BEGIN THE QUARANTINE INSTRUCTIONS AS OUTLINED IN YOUR HANDOUT.                Jason Thomas  08/10/2020   Your procedure is scheduled on: 08-26-20   Report to Jason Thomas Main  Entrance   Report to    Short stay  at     0515  AM     Call this number if you have problems the morning of surgery [phone]    Remember: Do not eat food  :After Midnight.you may have clear liquids until 0430 am then nothing by mouth    CLEAR LIQUID DIET   Foods Allowed                                                                                             Foods Excluded  Black Coffee and tea, regular and decaf                                                   liquids that you cannot  Plain Jell-O any favor except red or purple                                           see through such as: Fruit ices (not with fruit pulp)                                                                   milk, soups, orange juice  Iced Popsicles                                                                   All solid food Carbonated beverages, regular and diet  Cranberry, grape and apple juices Sports drinks like Gatorade Lightly seasoned clear broth or consume(fat free) Sugar, honey syrup  _____________________________________________________________________      BRUSH YOUR TEETH MORNING OF SURGERY AND RINSE YOUR MOUTH OUT, NO  CHEWING GUM CANDY OR MINTS.     Take these medicines the morning of surgery with A SIP OF WATER: protonix, methadone, carvedilol,wellbutrin, atorvastatin, amiodarone                                 You may not have any metal on your body including hair pins and              piercings  Do not wear jewelry,  lotions, powders or perfumes, deodorant                        Men may shave face and neck.   Do not bring valuables to the hospital. [facility] IS NOT             RESPONSIBLE   FOR VALUABLES.  Contacts, dentures or bridgework may not be worn into surgery.      Patients discharged the day of surgery will not be allowed to drive home. IF YOU ARE HAVING SURGERY AND GOING HOME THE SAME DAY, YOU MUST HAVE AN ADULT TO DRIVE YOU HOME AND BE WITH YOU FOR 24 HOURS. YOU MAY GO HOME BY TAXI OR UBER OR ORTHERWISE, BUT AN ADULT MUST ACCOMPANY YOU HOME AND STAY WITH YOU FOR 24 HOURS.  Name and phone number of your driver:  Special Instructions: N/A              Please read over the following fact sheets you were given: _____________________________________________________________________             Jason Thomas - Preparing for Surgery Before surgery, you can play an important role.  Because skin is not sterile, your skin needs to be as free of germs as possible.  You can reduce the number of germs on your skin by washing with CHG (chlorahexidine gluconate) soap before surgery.  CHG is an antiseptic cleaner which kills germs and bonds with the skin to continue killing germs even after washing. Please DO NOT use if you have an allergy to CHG or antibacterial soaps.  If your skin becomes reddened/irritated stop using the CHG and inform your nurse when you arrive at Short Stay. Do not shave (including legs and underarms) for at least 48 hours prior to the first CHG shower.  You may shave your face/neck. Please follow these instructions carefully:  1.  Shower with CHG Soap the night before surgery  and the  morning of Surgery.  2.  If you choose to wash your hair, wash your hair first as usual with your  normal  shampoo.  3.  After you shampoo, rinse your hair and body thoroughly to remove the  shampoo.                           4.  Use CHG as you would any other liquid soap.  You can apply chg directly  to the skin and wash                       Gently with a scrungie or clean washcloth.  5.  Apply the CHG Soap to  your body ONLY FROM THE NECK DOWN.   Do not use on face/ open                           Wound or open sores. Avoid contact with eyes, ears mouth and genitals (private parts).                       Wash face,  Genitals (private parts) with your normal soap.             6.  Wash thoroughly, paying special attention to the area where your surgery  will be performed.  7.  Thoroughly rinse your body with warm water from the neck down.  8.  DO NOT shower/wash with your normal soap after using and rinsing off  the CHG Soap.                9.  Pat yourself dry with a clean towel.            10.  Wear clean pajamas.            11.  Place clean sheets on your bed the night of your first shower and do not  sleep with pets. Day of Surgery : Do not apply any lotions/deodorants the morning of surgery.  Please wear clean clothes to the hospital/surgery Thomas.  FAILURE TO FOLLOW THESE INSTRUCTIONS MAY RESULT IN THE CANCELLATION OF YOUR SURGERY PATIENT SIGNATURE_________________________________  NURSE SIGNATURE__________________________________  ________________________________________________________________________   Rogelia MireIncentive Spirometer  An incentive spirometer is a tool that can help keep your lungs clear and active. This tool measures how well you are filling your lungs with each breath. Taking long deep breaths may help reverse or decrease the chance of developing breathing (pulmonary) problems (especially infection) following:  A long period of time when you are unable to move or  be active. BEFORE THE PROCEDURE   If the spirometer includes an indicator to show your best effort, your nurse or respiratory therapist will set it to a desired goal.  If possible, sit up straight or lean slightly forward. Try not to slouch.  Hold the incentive spirometer in an upright position. INSTRUCTIONS FOR USE  1. Sit on the edge of your bed if possible, or sit up as far as you can in bed or on a chair. 2. Hold the incentive spirometer in an upright position. 3. Breathe out normally. 4. Place the mouthpiece in your mouth and seal your lips tightly around it. 5. Breathe in slowly and as deeply as possible, raising the piston or the ball toward the top of the column. 6. Hold your breath for 3-5 seconds or for as long as possible. Allow the piston or ball to fall to the bottom of the column. 7. Remove the mouthpiece from your mouth and breathe out normally. 8. Rest for a few seconds and repeat Steps 1 through 7 at least 10 times every 1-2 hours when you are awake. Take your time and take a few normal breaths between deep breaths. 9. The spirometer may include an indicator to show your best effort. Use the indicator as a goal to work toward during each repetition. 10. After each set of 10 deep breaths, practice coughing to be sure your lungs are clear. If you have an incision (the cut made at the time of surgery), support your incision when coughing by placing a pillow or rolled up towels firmly against  it. Once you are able to get out of bed, walk around indoors and cough well. You may stop using the incentive spirometer when instructed by your caregiver.  RISKS AND COMPLICATIONS  Take your time so you do not get dizzy or light-headed.  If you are in pain, you may need to take or ask for pain medication before doing incentive spirometry. It is harder to take a deep breath if you are having pain. AFTER USE  Rest and breathe slowly and easily.  It can be helpful to keep track of a log  of your progress. Your caregiver can provide you with a simple table to help with this. If you are using the spirometer at home, follow these instructions: SEEK MEDICAL CARE IF:   You are having difficultly using the spirometer.  You have trouble using the spirometer as often as instructed.  Your pain medication is not giving enough relief while using the spirometer.  You develop fever of 100.5 F (38.1 C) or higher. SEEK IMMEDIATE MEDICAL CARE IF:   You cough up bloody sputum that had not been present before.  You develop fever of 102 F (38.9 C) or greater.  You develop worsening pain at or near the incision site. MAKE SURE YOU:   Understand these instructions.  Will watch your condition.  Will get help right away if you are not doing well or get worse. Document Released: 08/27/2006 Document Revised: 07/09/2011 Document Reviewed: 10/28/2006 [facility] Patient Information 2014 [place], [place].   ________________________________________________________________________

## 2020-08-10 NOTE — Progress Notes (Addendum)
PCP - Maryjean Ka, MD Cardiologist - clearance Vernona Rieger ,MD 07-25-20 EPIC  PPM/ICD -  Device Orders -  Rep Notified -   Chest x-ray -  EKG - 11-04-19 care every where requested Stress Test - 07-14-19 care everywhere ECHO -  Cardiac Cath -   Sleep Study -  CPAP -   Fasting Blood Sugar -  Checks Blood Sugar _____ times a day  Blood Thinner Instructions: Aspirin Instructions: 81 mg wants to  Stay on   ERAS Protcol - PRE-SURGERY Ensure or G2-   COVID TEST- 08-24-20  Activity--Able to yard work without SOB Anesthesia review: CHF,CAD/ STENT , MI 2013,ICD Patient denies shortness of breath, fever, cough and chest pain at PAT appointment   All instructions explained to the patient, with a verbal understanding of the material. Patient agrees to go over the instructions while at home for a better understanding. Patient also instructed to self quarantine after being tested for COVID-19. The opportunity to ask questions was provided.

## 2020-08-10 NOTE — Progress Notes (Signed)
Please place orders in epic pt. Is scheduled for preop 

## 2020-08-16 NOTE — Care Plan (Signed)
Ortho Bundle Case Management Note  Patient Details  Name: Jason Thomas MRN: [id number] Date of Birth: [birth year]   met with patient in the office prior to surgery. Will discharge to home with family to assist. Rolling walker and CPM ordered for home use. HHPT referral to [facility] and OPPT set up with [facility]. Patient and MD in agreement with plan. Choice offered.                   DME Arranged:  Gilford Rile rolling,CPM DME Agency:  [facility]  HH Arranged:  PT [place] Agency:  [facility])  Additional Comments: Please contact me with any questions of if this plan should need to change.  Ladell Heads,  [place] [facility]ist  [phone] 08/16/2020, 10:02 AM

## 2020-08-17 ENCOUNTER — Encounter (HOSPITAL_COMMUNITY)
Admission: RE | Admit: 2020-08-17 | Discharge: 2020-08-17 | Disposition: A | Discharge status: Home / Self Care | Payer: Medicare Other | Source: Ambulatory Visit | Attending: Orthopedic Surgery | Admitting: Orthopedic Surgery

## 2020-08-17 ENCOUNTER — Other Ambulatory Visit: Payer: Self-pay

## 2020-08-17 ENCOUNTER — Encounter (HOSPITAL_COMMUNITY): Payer: Self-pay

## 2020-08-17 DIAGNOSIS — Z01818 Encounter for other preprocedural examination: Secondary | ICD-10-CM | POA: Diagnosis present

## 2020-08-17 HISTORY — DX: Presence of cardiac pacemaker: Z95.0

## 2020-08-17 HISTORY — DX: Presence of automatic (implantable) cardiac defibrillator: Z95.810

## 2020-08-17 LAB — BASIC METABOLIC PANEL
Anion gap: 9 (ref 5–15)
BUN: 26 mg/dL — ABNORMAL HIGH (ref 6–20)
CO2: 25 mmol/L (ref 22–32)
Calcium: 9.3 mg/dL (ref 8.9–10.3)
Chloride: 110 mmol/L (ref 98–111)
Creatinine, Ser: 1.44 mg/dL — ABNORMAL HIGH (ref 0.61–1.24)
GFR, Estimated: 56 mL/min — ABNORMAL LOW (ref >60)
Glucose, Bld: 118 mg/dL — ABNORMAL HIGH (ref 70–99)
Potassium: 4.8 mmol/L (ref 3.5–5.1)
Sodium: 144 mmol/L (ref 135–145)

## 2020-08-17 LAB — CBC
HCT: 42.7 % (ref 39.0–52.0)
Hemoglobin: 14.3 g/dL (ref 13.0–17.0)
MCH: 29.7 pg (ref 26.0–34.0)
MCHC: 33.5 g/dL (ref 30.0–36.0)
MCV: 88.8 fL (ref 80.0–100.0)
Platelets: 223 10*3/uL (ref 150–400)
RBC: 4.81 MIL/uL (ref 4.22–5.81)
RDW: 12.6 % (ref 11.5–15.5)
WBC: 6.7 10*3/uL (ref 4.0–10.5)
nRBC: 0 % (ref 0.0–0.2)

## 2020-08-17 LAB — SURGICAL PCR SCREEN
MRSA, PCR: NEGATIVE
Staphylococcus aureus: NEGATIVE

## 2020-08-18 ENCOUNTER — Ambulatory Visit: Payer: Self-pay | Admitting: Physician Assistant

## 2020-08-18 NOTE — H&P (Signed)
TOTAL KNEE ADMISSION H&P  Patient is being admitted for left total knee arthroplasty.  Subjective:  Chief Complaint:left knee pain.  HPI: Jason Thomas, 60 y.o. male, has a history of pain and functional disability in the left knee due to arthritis and has failed non-surgical conservative treatments for greater than 12 weeks to includeNSAID's and/or analgesics, flexibility and strengthening excercises and activity modification.  Onset of symptoms was gradual, starting 7 years ago with gradually worsening course since that time. The patient noted no past surgery on the left knee(s).  Patient currently rates pain in the left knee(s) at 10 out of 10 with activity. Patient has night pain, worsening of pain with activity and weight bearing, pain that interferes with activities of daily living, pain with passive range of motion, crepitus and joint swelling.  Patient has evidence of periarticular osteophytes and joint space narrowing by imaging studies.There is no active infection.  Patient Active Problem List   Diagnosis Date Noted  . Cervical spondylosis with myelopathy and radiculopathy 08/10/2019  . Acute CHF (congestive heart failure) (HCC) 06/15/2019  . ARF (acute renal failure) (HCC) 06/15/2019  . Arteriosclerosis of coronary artery 10/03/2015  . Chronic cervical pain 10/03/2015  . Chronic pain 10/03/2015  . Degeneration of intervertebral disc of cervical region 10/03/2015  . Family history of cardiac disorder 10/03/2015  . Ex-smoker 10/03/2015  . Acid reflux 10/03/2015  . H/O acute myocardial infarction 10/03/2015  . HLD (hyperlipidemia) 10/03/2015  . BP (high blood pressure) 10/03/2015  . Automatic implantable cardioverter-defibrillator in situ 10/03/2015  . LBP (low back pain) 10/03/2015  . Presence of coronary angioplasty implant and graft 10/03/2015  . Cardiomyopathy (HCC) 02/07/2015  . Chronic systolic heart failure (HCC) 02/07/2015  . Chest pain at rest 10/23/2014  . Decreased  potassium in the blood 10/23/2014  . Anemia, iron deficiency 10/23/2014  . Chest wall pain 07/11/2012  . Cervical nerve root disorder 05/30/2012  . Long term current use of opiate analgesic 05/30/2012  . Chronic pain associated with significant psychosocial dysfunction 05/30/2012  . Pain in shoulder 04/18/2012  . Abnormal CAT scan 11/06/2011  . Myocardial infarction (HCC) 11/06/2011   Past Medical History:  Diagnosis Date  . AICD (automatic cardioverter/defibrillator) present   . Arthritis 2019  . CAD (coronary artery disease)   . CHF (congestive heart failure) (HCC)   . Chronic pain   . Chronic systolic heart failure (HCC)   . GERD (gastroesophageal reflux disease)   . History of kidney stones   . Hyperlipidemia   . Hypertension   . Ischemic cardiomyopathy   . Myocardial infarction (HCC)    2013  . Presence of permanent cardiac pacemaker     Past Surgical History:  Procedure Laterality Date  . ANTERIOR CERVICAL DECOMP/DISCECTOMY FUSION N/A 08/10/2019   Procedure: ANTERIOR CERVICAL DECOMPRESSION/DISCECTOMY FUSION, INTERBODY PROSTHESIS, PLATE/SCREWS CERVICAL THREE- CERVICAL FOUR, CERVICAL FIVE CERVICAL SIX;  Surgeon: Tressie Stalker, MD;  Location: [facility];  Service: Neurosurgery;  Laterality: N/A;  anterior  . CARDIAC CATHETERIZATION    . CORONARY ANGIOPLASTY    . INSERT / REPLACE / REMOVE PACEMAKER    . neck fusion      Current Outpatient Medications  Medication Sig Dispense Refill Last Dose  . amiodarone (PACERONE) 200 MG tablet Take 100 mg by mouth daily.      Marland Kitchen aspirin EC 81 MG EC tablet Take 1 tablet (81 mg total) by mouth daily. 30 tablet 0   . atorvastatin (LIPITOR) 80 MG tablet Take 80 mg  by mouth daily.      . buPROPion (WELLBUTRIN SR) 150 MG 12 hr tablet Take 150 mg by mouth 2 (two) times daily.     . carvedilol (COREG) 12.5 MG tablet Take 12.5 mg by mouth in the morning and at bedtime.      . Coenzyme Q10 (COQ10) 100 MG CAPS Take 100 mg by mouth daily.     .  docusate sodium (COLACE) 100 MG capsule Take 100 mg by mouth daily.     . ezetimibe (ZETIA) 10 MG tablet Take 10 mg by mouth daily.     . famotidine (PEPCID) 40 MG tablet Take 40 mg by mouth at bedtime.     . furosemide (LASIX) 20 MG tablet Take 20 mg by mouth daily as needed for edema.     . furosemide (LASIX) 40 MG tablet Take 1 tablet (40 mg total) by mouth daily. (Patient not taking: Reported on 08/04/2020) 30 tablet 0   . lisinopril (ZESTRIL) 5 MG tablet Take 1 tablet (5 mg total) by mouth in the morning and at bedtime. Resume in 1 week (Patient taking differently: Take 5 mg by mouth in the morning and at bedtime.)     . methadone (DOLOPHINE) 10 MG/5ML solution Take 20 mg by mouth daily.     . Multiple Vitamins-Minerals (MULTIVITAMIN WITH MINERALS) tablet Take 1 tablet by mouth daily.     . nitroGLYCERIN (NITROSTAT) 0.4 MG SL tablet Place 0.4 mg under the tongue every 5 (five) minutes as needed for chest pain.      . Omega-3 Fatty Acids (FISH OIL) 1000 MG CAPS Take 1,000 mg by mouth daily.     . oxaprozin (DAYPRO) 600 MG tablet Take 600 mg by mouth daily as needed (pain).     . pantoprazole (PROTONIX) 40 MG tablet Take 40 mg by mouth daily.     . spironolactone (ALDACTONE) 25 MG tablet Take 25 mg by mouth daily.      No current facility-administered medications for this visit.   Allergies  Allergen Reactions  . Prednisone Other (See Comments)    Significant increase in violent temper,which led to an arrest for disorderly conduct.    Social History   Tobacco Use  . Smoking status: Former Smoker    Years: 15.00  . Smokeless tobacco: Never Used  . Tobacco comment: quit in 2013  Substance Use Topics  . Alcohol use: Never    Family History  Problem Relation Age of Onset  . CAD Father      Review of Systems  Cardiovascular: Positive for chest pain and leg swelling.  Gastrointestinal: Positive for constipation.  Genitourinary: Positive for frequency.  All other systems reviewed  and are negative.   Objective:  Physical Exam Constitutional:      General: He is not in acute distress.    Appearance: Normal appearance.  HENT:     Head: Normocephalic and atraumatic.  Eyes:     Extraocular Movements: Extraocular movements intact.     Pupils: Pupils are equal, round, and reactive to light.  Cardiovascular:     Rate and Rhythm: Normal rate and regular rhythm.     Pulses: Normal pulses.     Heart sounds: Normal heart sounds.  Pulmonary:     Effort: Pulmonary effort is normal. No respiratory distress.     Breath sounds: Normal breath sounds.  Abdominal:     General: Abdomen is flat. Bowel sounds are normal. There is no distension.       Palpations: Abdomen is soft.     Tenderness: There is no abdominal tenderness.  Musculoskeletal:     Cervical back: Normal range of motion and neck supple.     Left knee: Swelling, bony tenderness and crepitus present. Tenderness present.  Lymphadenopathy:     Cervical: No cervical adenopathy.  Skin:    General: Skin is warm and dry.     Findings: No erythema or rash.  Neurological:     General: No focal deficit present.     Mental Status: He is alert and oriented to person, place, and time.  Psychiatric:        Mood and Affect: Mood normal.        Behavior: Behavior normal.     Vital signs in last 24 hours: @VSRANGES @  Labs:   Estimated body mass index is 35.67 kg/m as calculated from the following:   Height as of 08/17/20: 6' (1.829 m).   Weight as of 08/17/20: 119.3 kg.   Imaging Review Plain radiographs demonstrate moderate degenerative joint disease of the left knee(s). The overall alignment ismild varus. The bone quality appears to be good for age and reported activity level.      Assessment/Plan:  End stage arthritis, left knee   The patient history, physical examination, clinical judgment of the provider and imaging studies are consistent with end stage degenerative joint disease of the left knee(s)  and total knee arthroplasty is deemed medically necessary. The treatment options including medical management, injection therapy arthroscopy and arthroplasty were discussed at length. The risks and benefits of total knee arthroplasty were presented and reviewed. The risks due to aseptic loosening, infection, stiffness, patella tracking problems, thromboembolic complications and other imponderables were discussed. The patient acknowledged the explanation, agreed to proceed with the plan and consent was signed. Patient is being admitted for inpatient treatment for surgery, pain control, PT, OT, prophylactic antibiotics, VTE prophylaxis, progressive ambulation and ADL's and discharge planning. The patient is planning to be discharged home with home health services    Anticipated LOS equal to or greater than 2 midnights due to - Age 35 and older with one or more of the following:  - Obesity  - Expected need for hospital services (PT, OT, Nursing) required for safe  discharge  - Anticipated need for postoperative skilled nursing care or inpatient rehab  - Active co-morbidities: Chronic pain requiring opiods, Coronary Artery Disease and Heart Failure OR   - Unanticipated findings during/Post Surgery: None  - Patient is a high risk of re-admission due to: None

## 2020-08-18 NOTE — H&P (View-Only) (Signed)
TOTAL KNEE ADMISSION H&P  Patient is being admitted for left total knee arthroplasty.  Subjective:  Chief Complaint:left knee pain.  HPI: Jason Thomas, 60 y.o. male, has a history of pain and functional disability in the left knee due to arthritis and has failed non-surgical conservative treatments for greater than 12 weeks to includeNSAID's and/or analgesics, flexibility and strengthening excercises and activity modification.  Onset of symptoms was gradual, starting 7 years ago with gradually worsening course since that time. The patient noted no past surgery on the left knee(s).  Patient currently rates pain in the left knee(s) at 10 out of 10 with activity. Patient has night pain, worsening of pain with activity and weight bearing, pain that interferes with activities of daily living, pain with passive range of motion, crepitus and joint swelling.  Patient has evidence of periarticular osteophytes and joint space narrowing by imaging studies.There is no active infection.  Patient Active Problem List   Diagnosis Date Noted  . Cervical spondylosis with myelopathy and radiculopathy 08/10/2019  . Acute CHF (congestive heart failure) (HCC) 06/15/2019  . ARF (acute renal failure) (HCC) 06/15/2019  . Arteriosclerosis of coronary artery 10/03/2015  . Chronic cervical pain 10/03/2015  . Chronic pain 10/03/2015  . Degeneration of intervertebral disc of cervical region 10/03/2015  . Family history of cardiac disorder 10/03/2015  . Ex-smoker 10/03/2015  . Acid reflux 10/03/2015  . H/O acute myocardial infarction 10/03/2015  . HLD (hyperlipidemia) 10/03/2015  . BP (high blood pressure) 10/03/2015  . Automatic implantable cardioverter-defibrillator in situ 10/03/2015  . LBP (low back pain) 10/03/2015  . Presence of coronary angioplasty implant and graft 10/03/2015  . Cardiomyopathy (HCC) 02/07/2015  . Chronic systolic heart failure (HCC) 02/07/2015  . Chest pain at rest 10/23/2014  . Decreased  potassium in the blood 10/23/2014  . Anemia, iron deficiency 10/23/2014  . Chest wall pain 07/11/2012  . Cervical nerve root disorder 05/30/2012  . Long term current use of opiate analgesic 05/30/2012  . Chronic pain associated with significant psychosocial dysfunction 05/30/2012  . Pain in shoulder 04/18/2012  . Abnormal CAT scan 11/06/2011  . Myocardial infarction (HCC) 11/06/2011   Past Medical History:  Diagnosis Date  . AICD (automatic cardioverter/defibrillator) present   . Arthritis 2019  . CAD (coronary artery disease)   . CHF (congestive heart failure) (HCC)   . Chronic pain   . Chronic systolic heart failure (HCC)   . GERD (gastroesophageal reflux disease)   . History of kidney stones   . Hyperlipidemia   . Hypertension   . Ischemic cardiomyopathy   . Myocardial infarction (HCC)    2013  . Presence of permanent cardiac pacemaker     Past Surgical History:  Procedure Laterality Date  . ANTERIOR CERVICAL DECOMP/DISCECTOMY FUSION N/A 08/10/2019   Procedure: ANTERIOR CERVICAL DECOMPRESSION/DISCECTOMY FUSION, INTERBODY PROSTHESIS, PLATE/SCREWS CERVICAL THREE- CERVICAL FOUR, CERVICAL FIVE CERVICAL SIX;  Surgeon: Tressie Stalker, MD;  Location: [facility] OR;  Service: Neurosurgery;  Laterality: N/A;  anterior  . CARDIAC CATHETERIZATION    . CORONARY ANGIOPLASTY    . INSERT / REPLACE / REMOVE PACEMAKER    . neck fusion      Current Outpatient Medications  Medication Sig Dispense Refill Last Dose  . amiodarone (PACERONE) 200 MG tablet Take 100 mg by mouth daily.      Marland Kitchen aspirin EC 81 MG EC tablet Take 1 tablet (81 mg total) by mouth daily. 30 tablet 0   . atorvastatin (LIPITOR) 80 MG tablet Take 80 mg  by mouth daily.      Marland Kitchen buPROPion (WELLBUTRIN SR) 150 MG 12 hr tablet Take 150 mg by mouth 2 (two) times daily.     . carvedilol (COREG) 12.5 MG tablet Take 12.5 mg by mouth in the morning and at bedtime.      . Coenzyme Q10 (COQ10) 100 MG CAPS Take 100 mg by mouth daily.     Marland Kitchen  docusate sodium (COLACE) 100 MG capsule Take 100 mg by mouth daily.     Marland Kitchen ezetimibe (ZETIA) 10 MG tablet Take 10 mg by mouth daily.     . famotidine (PEPCID) 40 MG tablet Take 40 mg by mouth at bedtime.     . furosemide (LASIX) 20 MG tablet Take 20 mg by mouth daily as needed for edema.     . furosemide (LASIX) 40 MG tablet Take 1 tablet (40 mg total) by mouth daily. (Patient not taking: Reported on 08/04/2020) 30 tablet 0   . lisinopril (ZESTRIL) 5 MG tablet Take 1 tablet (5 mg total) by mouth in the morning and at bedtime. Resume in 1 week (Patient taking differently: Take 5 mg by mouth in the morning and at bedtime.)     . methadone (DOLOPHINE) 10 MG/5ML solution Take 20 mg by mouth daily.     . Multiple Vitamins-Minerals (MULTIVITAMIN WITH MINERALS) tablet Take 1 tablet by mouth daily.     . nitroGLYCERIN (NITROSTAT) 0.4 MG SL tablet Place 0.4 mg under the tongue every 5 (five) minutes as needed for chest pain.      . Omega-3 Fatty Acids (FISH OIL) 1000 MG CAPS Take 1,000 mg by mouth daily.     Marland Kitchen oxaprozin (DAYPRO) 600 MG tablet Take 600 mg by mouth daily as needed (pain).     . pantoprazole (PROTONIX) 40 MG tablet Take 40 mg by mouth daily.     Marland Kitchen spironolactone (ALDACTONE) 25 MG tablet Take 25 mg by mouth daily.      No current facility-administered medications for this visit.   Allergies  Allergen Reactions  . Prednisone Other (See Comments)    Significant increase in violent temper,which led to an arrest for disorderly conduct.    Social History   Tobacco Use  . Smoking status: Former Smoker    Years: 15.00  . Smokeless tobacco: Never Used  . Tobacco comment: quit in 2013  Substance Use Topics  . Alcohol use: Never    Family History  Problem Relation Age of Onset  . CAD Father      Review of Systems  Cardiovascular: Positive for chest pain and leg swelling.  Gastrointestinal: Positive for constipation.  Genitourinary: Positive for frequency.  All other systems reviewed  and are negative.   Objective:  Physical Exam Constitutional:      General: He is not in acute distress.    Appearance: Normal appearance.  HENT:     Head: Normocephalic and atraumatic.  Eyes:     Extraocular Movements: Extraocular movements intact.     Pupils: Pupils are equal, round, and reactive to light.  Cardiovascular:     Rate and Rhythm: Normal rate and regular rhythm.     Pulses: Normal pulses.     Heart sounds: Normal heart sounds.  Pulmonary:     Effort: Pulmonary effort is normal. No respiratory distress.     Breath sounds: Normal breath sounds.  Abdominal:     General: Abdomen is flat. Bowel sounds are normal. There is no distension.  Palpations: Abdomen is soft.     Tenderness: There is no abdominal tenderness.  Musculoskeletal:     Cervical back: Normal range of motion and neck supple.     Left knee: Swelling, bony tenderness and crepitus present. Tenderness present.  Lymphadenopathy:     Cervical: No cervical adenopathy.  Skin:    General: Skin is warm and dry.     Findings: No erythema or rash.  Neurological:     General: No focal deficit present.     Mental Status: He is alert and oriented to person, place, and time.  Psychiatric:        Mood and Affect: Mood normal.        Behavior: Behavior normal.     Vital signs in last 24 hours: @VSRANGES @  Labs:   Estimated body mass index is 35.67 kg/m as calculated from the following:   Height as of 08/17/20: 6' (1.829 m).   Weight as of 08/17/20: 119.3 kg.   Imaging Review Plain radiographs demonstrate moderate degenerative joint disease of the left knee(s). The overall alignment ismild varus. The bone quality appears to be good for age and reported activity level.      Assessment/Plan:  End stage arthritis, left knee   The patient history, physical examination, clinical judgment of the provider and imaging studies are consistent with end stage degenerative joint disease of the left knee(s)  and total knee arthroplasty is deemed medically necessary. The treatment options including medical management, injection therapy arthroscopy and arthroplasty were discussed at length. The risks and benefits of total knee arthroplasty were presented and reviewed. The risks due to aseptic loosening, infection, stiffness, patella tracking problems, thromboembolic complications and other imponderables were discussed. The patient acknowledged the explanation, agreed to proceed with the plan and consent was signed. Patient is being admitted for inpatient treatment for surgery, pain control, PT, OT, prophylactic antibiotics, VTE prophylaxis, progressive ambulation and ADL's and discharge planning. The patient is planning to be discharged home with home health services    Anticipated LOS equal to or greater than 2 midnights due to - Age 35 and older with one or more of the following:  - Obesity  - Expected need for hospital services (PT, OT, Nursing) required for safe  discharge  - Anticipated need for postoperative skilled nursing care or inpatient rehab  - Active co-morbidities: Chronic pain requiring opiods, Coronary Artery Disease and Heart Failure OR   - Unanticipated findings during/Post Surgery: None  - Patient is a high risk of re-admission due to: None

## 2020-08-22 NOTE — Progress Notes (Signed)
Faxed device orders

## 2020-08-23 NOTE — Anesthesia Preprocedure Evaluation (Addendum)
Anesthesia Evaluation  Patient identified by MRN, date of birth, ID band Patient awake    Reviewed: Allergy & Precautions, NPO status , Patient's Chart, lab work & pertinent test results  Airway Mallampati: III  TM Distance: >3 FB Neck ROM: Full    Dental  (+) Dental Advisory Given, Teeth Intact   Pulmonary neg pulmonary ROS, neg shortness of breath, neg sleep apnea, neg COPD, neg recent URI, former smoker,  Covid-19 Nucleic Acid Test Results Lab Results      Component                Value               Date                      SARSCOV2NAA              NEGATIVE            08/24/2020                SARSCOV2NAA              POSITIVE (A)        06/15/2019              breath sounds clear to auscultation       Cardiovascular hypertension, + CAD, + Past MI and +CHF  + pacemaker + Cardiac Defibrillator  Rhythm:Regular     Neuro/Psych neg Seizures  Neuromuscular disease negative psych ROS   GI/Hepatic Neg liver ROS, GERD  Medicated and Controlled,  Endo/Other    Renal/GU CRFRenal diseaseLab Results      Component                Value               Date                      CREATININE               1.44 (H)            08/17/2020                Musculoskeletal  (+) Arthritis ,   Abdominal   Peds  Hematology negative hematology ROS (+) Lab Results      Component                Value               Date                      WBC                      6.7                 08/17/2020                HGB                      14.3                08/17/2020                HCT                      42.7  08/17/2020                MCV                      88.8                08/17/2020                PLT                      223                 08/17/2020           Lab Results      Component                Value               Date                      INR                      1.1                 08/26/2020               Anesthesia Other Findings vvi 40, not pacer dependent, will place magnet  Reproductive/Obstetrics                            Anesthesia Physical Anesthesia Plan  ASA: III  Anesthesia Plan: MAC, Regional and Spinal   Post-op Pain Management:  Regional for Post-op pain   Induction:   PONV Risk Score and Plan: 1 and Propofol infusion and Treatment may vary due to age or medical condition  Airway Management Planned: Nasal Cannula  Additional Equipment: None  Intra-op Plan:   Post-operative Plan:   Informed Consent: I have reviewed the patients History and Physical, chart, labs and discussed the procedure including the risks, benefits and alternatives for the proposed anesthesia with the patient or authorized representative who has indicated his/her understanding and acceptance.     Dental advisory given  Plan Discussed with: CRNA and Surgeon  Anesthesia Plan Comments: (See PAT note 08/17/2020, Konrad Felix, PA-C   Stress Test 07/12/2020 Abnormal pharmacologic stress nuclear but negative for ischemia There is evidence of an infarct noted in the lateral segment  There is a scar in the antero-apical segment, apical segment Clinically negative stress test with no chest pain  Echo 02/06/2019 Findings  Mitral Valve  Normal mitral valve structure and mobility. No significant regurgitation.  Aortic Valve  Normal tricuspid aortic valve with pliable leaflets, no stenosis or  insufficiency.  Tricuspid Valve  Tricuspid valve is structurally normal.  Mild tricuspid regurgitation.  RVSP 27 mm Hg.  Pulmonic Valve  Pulmonic valve is structurally normal.  No Doppler evidence of pulmonic stenosis or insufficiency.  Left Atrium  Normal size left atrium.  Left atrial volume index of 22 ml per meters squared BSA.  Left Ventricle  There is borderline left ventricular hypertrophy.  Severely reduced LV systolic function with a large area of distal anterior,   distal inferior and apical dyskinesis.  LVEF estimated at 30-35%  Right Atrium  Normal right atrium.  Right Ventricle  Normal right ventricular size and function.  Pacer and/or defibrillator wires visualized in right ventricle.  Pericardial  Effusion  No evidence of pericardial effusion.  Pleural Effusion  No evidence of pleural effusion.  Miscellaneous  Mild dilationof the aortic root (4.1 cm) and the ascending aorta.  The IVC is normal )       Anesthesia Quick Evaluation

## 2020-08-23 NOTE — Progress Notes (Addendum)
Anesthesia Chart Review   Case: [id number] Date/Time: 08/26/20 0715   Procedure: TOTAL KNEE ARTHROPLASTY (Left Knee)   Anesthesia type: Choice   Pre-op diagnosis: DEGENERATIVE JOINT DISEASE LEFT KNEE   Location: [facility]   Surgeons: Frederico Hamman, MD      DISCUSSION:60 y.o. former smoker with h/o HTN, CHF (Ef 30-35%), AICD in place (Biotronik single chamber, has never shocked pt), CAD (stent to LAD 2013), GERD, renal insufficiency, left knee djd scheduled for above procedure 08/26/2020 with Dr. Frederico Hamman.   S/p cervical fusion C3-4,C5-6 on 08/10/2019. No anesthesia complications noted.  Glidescope used with this procedure, 7.5 mm tube.    Negative stress test 07/12/2020.   Pt last seen by cardiology 07/25/2020. Per OV note pt stable and well compensated. "He is considering knee surgery. At this point, he seems to be well compensated and should be at acceptable risk to proceed on with the surgery from a cardiology standpoint."  Device orders requested. Device rep notified.   With previous surgery ICD orders were not received.  Biotronik rep was at bedside and ICD turned off.  VS: BP (!) 148/89   Pulse 65   Temp 36.8 C (Oral)   Resp 16   Ht 6' (1.829 m)   Wt 119.3 kg   SpO2 97%   BMI 35.67 kg/m   PROVIDERS: Street, Stephanie Coup, MD is PCP   Vernona Rieger, MD is Cardiologist  LABS: Labs reviewed: Acceptable for surgery. (all labs ordered are listed, but only abnormal results are displayed)  Labs Reviewed  BASIC METABOLIC PANEL - Abnormal; Notable for the following components:      Result Value   Glucose, Bld 118 (*)    BUN 26 (*)    Creatinine, Ser 1.44 (*)    GFR, Estimated 56 (*)    All other components within normal limits  SURGICAL PCR SCREEN  CBC     IMAGES:   EKG: On chart   CV: Stress Test 07/12/2020 Abnormal pharmacologic stress nuclear but negative for ischemia There is evidence of an infarct noted in the lateral segment  There is a scar  in the antero-apical segment, apical segment Clinically negative stress test with no chest pain  Echo 02/06/2019 Findings  Mitral Valve  Normal mitral valve structure and mobility. No significant regurgitation.  Aortic Valve  Normal tricuspid aortic valve with pliable leaflets, no stenosis or  insufficiency.  Tricuspid Valve  Tricuspid valve is structurally normal.  Mild tricuspid regurgitation.  RVSP 27 mm Hg.  Pulmonic Valve  Pulmonic valve is structurally normal.  No Doppler evidence of pulmonic stenosis or insufficiency.  Left Atrium  Normal size left atrium.  Left atrial volume index of 22 ml per meters squared BSA.  Left Ventricle  There is borderline left ventricular hypertrophy.  Severely reduced LV systolic function with a large area of distal anterior,  distal inferior and apical dyskinesis.  LVEF estimated at 30-35%  Right Atrium  Normal right atrium.  Right Ventricle  Normal right ventricular size and function.  Pacer and/or defibrillator wires visualized in right ventricle.  Pericardial Effusion  No evidence of pericardial effusion.  Pleural Effusion  No evidence of pleural effusion.  Miscellaneous  Mild dilationof the aortic root (4.1 cm) and the ascending aorta.  The IVC is normal   Past Medical History:  Diagnosis Date  . AICD (automatic cardioverter/defibrillator) present   . Arthritis 2019  . CAD (coronary artery disease)   . CHF (congestive heart  failure) (HCC)   . Chronic pain   . Chronic systolic heart failure (HCC)   . GERD (gastroesophageal reflux disease)   . History of kidney stones   . Hyperlipidemia   . Hypertension   . Ischemic cardiomyopathy   . Myocardial infarction (HCC)    2013  . Presence of permanent cardiac pacemaker     Past Surgical History:  Procedure Laterality Date  . ANTERIOR CERVICAL DECOMP/DISCECTOMY FUSION N/A 08/10/2019   Procedure: ANTERIOR CERVICAL DECOMPRESSION/DISCECTOMY FUSION, INTERBODY PROSTHESIS,  PLATE/SCREWS CERVICAL THREE- CERVICAL FOUR, CERVICAL FIVE CERVICAL SIX;  Surgeon: Tressie Stalker, MD;  Location: [facility] OR;  Service: Neurosurgery;  Laterality: N/A;  anterior  . CARDIAC CATHETERIZATION    . CORONARY ANGIOPLASTY    . INSERT / REPLACE / REMOVE PACEMAKER    . neck fusion      MEDICATIONS: . amiodarone (PACERONE) 200 MG tablet  . aspirin EC 81 MG EC tablet  . atorvastatin (LIPITOR) 80 MG tablet  . buPROPion (WELLBUTRIN SR) 150 MG 12 hr tablet  . carvedilol (COREG) 12.5 MG tablet  . Coenzyme Q10 (COQ10) 100 MG CAPS  . docusate sodium (COLACE) 100 MG capsule  . ezetimibe (ZETIA) 10 MG tablet  . famotidine (PEPCID) 40 MG tablet  . furosemide (LASIX) 20 MG tablet  . furosemide (LASIX) 40 MG tablet  . lisinopril (ZESTRIL) 5 MG tablet  . methadone (DOLOPHINE) 10 MG/5ML solution  . Multiple Vitamins-Minerals (MULTIVITAMIN WITH MINERALS) tablet  . nitroGLYCERIN (NITROSTAT) 0.4 MG SL tablet  . Omega-3 Fatty Acids (FISH OIL) 1000 MG CAPS  . oxaprozin (DAYPRO) 600 MG tablet  . pantoprazole (PROTONIX) 40 MG tablet  . spironolactone (ALDACTONE) 25 MG tablet   No current facility-administered medications for this encounter.    Jodell Cipro, PA-C [facility] [phone]

## 2020-08-24 ENCOUNTER — Other Ambulatory Visit (HOSPITAL_COMMUNITY)
Admission: RE | Admit: 2020-08-24 | Discharge: 2020-08-24 | Disposition: A | Discharge status: Home / Self Care | Payer: Medicare Other | Source: Ambulatory Visit | Attending: Orthopedic Surgery | Admitting: Orthopedic Surgery

## 2020-08-24 DIAGNOSIS — Z01812 Encounter for preprocedural laboratory examination: Secondary | ICD-10-CM | POA: Insufficient documentation

## 2020-08-24 DIAGNOSIS — Z20822 Contact with and (suspected) exposure to covid-19: Secondary | ICD-10-CM | POA: Insufficient documentation

## 2020-08-24 LAB — SARS CORONAVIRUS 2 (TAT 6-24 HRS): SARS Coronavirus 2: NEGATIVE

## 2020-08-25 MED ORDER — BUPIVACAINE LIPOSOME 1.3 % IJ SUSP
20.0000 mL | Freq: Once | INTRAMUSCULAR | Status: DC
  Filled 2020-08-25: qty 20

## 2020-08-25 MED ORDER — TRANEXAMIC ACID 1000 MG/10ML IV SOLN
2000.0000 mg | INTRAVENOUS | Status: DC
  Filled 2020-08-25: qty 20

## 2020-08-26 ENCOUNTER — Ambulatory Visit (HOSPITAL_COMMUNITY): Payer: Medicare Other | Admitting: Physician Assistant

## 2020-08-26 ENCOUNTER — Ambulatory Visit (HOSPITAL_COMMUNITY)
Admission: RE | Admit: 2020-08-26 | Discharge: 2020-08-26 | Disposition: A | Discharge status: Home / Self Care | Payer: Medicare Other | Source: Other Acute Inpatient Hospital | Attending: Orthopedic Surgery | Admitting: Orthopedic Surgery

## 2020-08-26 ENCOUNTER — Ambulatory Visit (HOSPITAL_COMMUNITY): Payer: Medicare Other | Admitting: Registered Nurse

## 2020-08-26 ENCOUNTER — Encounter (HOSPITAL_COMMUNITY)
Admission: RE | Disposition: A | Discharge status: Home / Self Care | Payer: Self-pay | Source: Other Acute Inpatient Hospital | Attending: Orthopedic Surgery

## 2020-08-26 ENCOUNTER — Encounter (HOSPITAL_COMMUNITY): Payer: Self-pay | Admitting: Orthopedic Surgery

## 2020-08-26 DIAGNOSIS — Z955 Presence of coronary angioplasty implant and graft: Secondary | ICD-10-CM | POA: Diagnosis not present

## 2020-08-26 DIAGNOSIS — M25762 Osteophyte, left knee: Secondary | ICD-10-CM | POA: Insufficient documentation

## 2020-08-26 DIAGNOSIS — Z79899 Other long term (current) drug therapy: Secondary | ICD-10-CM | POA: Diagnosis not present

## 2020-08-26 DIAGNOSIS — Z79891 Long term (current) use of opiate analgesic: Secondary | ICD-10-CM | POA: Insufficient documentation

## 2020-08-26 DIAGNOSIS — M21162 Varus deformity, not elsewhere classified, left knee: Secondary | ICD-10-CM | POA: Diagnosis not present

## 2020-08-26 DIAGNOSIS — M1712 Unilateral primary osteoarthritis, left knee: Secondary | ICD-10-CM | POA: Diagnosis present

## 2020-08-26 DIAGNOSIS — I517 Cardiomegaly: Secondary | ICD-10-CM | POA: Insufficient documentation

## 2020-08-26 DIAGNOSIS — Z7982 Long term (current) use of aspirin: Secondary | ICD-10-CM | POA: Diagnosis not present

## 2020-08-26 DIAGNOSIS — M24562 Contracture, left knee: Secondary | ICD-10-CM | POA: Insufficient documentation

## 2020-08-26 DIAGNOSIS — Z95810 Presence of automatic (implantable) cardiac defibrillator: Secondary | ICD-10-CM | POA: Diagnosis not present

## 2020-08-26 DIAGNOSIS — Z87891 Personal history of nicotine dependence: Secondary | ICD-10-CM | POA: Diagnosis not present

## 2020-08-26 DIAGNOSIS — Z888 Allergy status to other drugs, medicaments and biological substances status: Secondary | ICD-10-CM | POA: Insufficient documentation

## 2020-08-26 HISTORY — PX: TOTAL KNEE ARTHROPLASTY: SHX125

## 2020-08-26 LAB — PROTIME-INR
INR: 1.1 (ref 0.8–1.2)
Prothrombin Time: 14.5 seconds (ref 11.4–15.2)

## 2020-08-26 LAB — TYPE AND SCREEN
ABO/RH(D): A POS
Antibody Screen: NEGATIVE

## 2020-08-26 LAB — APTT: aPTT: 27 seconds (ref 24–36)

## 2020-08-26 SURGERY — ARTHROPLASTY, KNEE, TOTAL
Anesthesia: Monitor Anesthesia Care | Site: Knee | Laterality: Left

## 2020-08-26 MED ORDER — ASPIRIN 81 MG PO TBEC: 81.0000 mg | DELAYED_RELEASE_TABLET | Freq: Two times a day (BID) | ORAL | 0 refills | Status: AC

## 2020-08-26 MED ORDER — MIDAZOLAM HCL 2 MG/2ML IJ SOLN
INTRAMUSCULAR | Status: AC
  Filled 2020-08-26: qty 2

## 2020-08-26 MED ORDER — BUPIVACAINE-EPINEPHRINE (PF) 0.25% -1:200000 IJ SOLN
INTRAMUSCULAR | Status: DC | PRN
  Administered 2020-08-26: 30 mL

## 2020-08-26 MED ORDER — OXYCODONE HCL 5 MG/5ML PO SOLN: 5.0000 mg | Freq: Once | ORAL | Status: AC | PRN

## 2020-08-26 MED ORDER — SODIUM CHLORIDE (PF) 0.9 % IJ SOLN
INTRAMUSCULAR | Status: AC
  Filled 2020-08-26: qty 50

## 2020-08-26 MED ORDER — SODIUM CHLORIDE 0.9 % IV SOLN
INTRAVENOUS | Status: DC | PRN
  Administered 2020-08-26: 1000 mL

## 2020-08-26 MED ORDER — EPHEDRINE 5 MG/ML INJ
INTRAVENOUS | Status: AC
  Filled 2020-08-26: qty 10

## 2020-08-26 MED ORDER — CEFAZOLIN SODIUM-DEXTROSE 2-4 GM/100ML-% IV SOLN
INTRAVENOUS | Status: AC
  Filled 2020-08-26: qty 100

## 2020-08-26 MED ORDER — ONDANSETRON HCL 4 MG/2ML IJ SOLN
INTRAMUSCULAR | Status: DC | PRN
  Administered 2020-08-26: 4 mg via INTRAVENOUS

## 2020-08-26 MED ORDER — FENTANYL CITRATE (PF) 100 MCG/2ML IJ SOLN: 25.0000 ug | INTRAMUSCULAR | Status: DC | PRN

## 2020-08-26 MED ORDER — POVIDONE-IODINE 10 % EX SWAB
2.0000 "application " | Freq: Once | CUTANEOUS | Status: AC
  Administered 2020-08-26: 2 via TOPICAL

## 2020-08-26 MED ORDER — ROPIVACAINE HCL 7.5 MG/ML IJ SOLN
INTRAMUSCULAR | Status: DC | PRN
  Administered 2020-08-26: 20 mL via PERINEURAL

## 2020-08-26 MED ORDER — SODIUM CHLORIDE 0.9 % IR SOLN
Status: DC | PRN
  Administered 2020-08-26: 1000 mL

## 2020-08-26 MED ORDER — PROPOFOL 10 MG/ML IV BOLUS
INTRAVENOUS | Status: AC
  Filled 2020-08-26: qty 20

## 2020-08-26 MED ORDER — LACTATED RINGERS IV SOLN: INTRAVENOUS | Status: DC

## 2020-08-26 MED ORDER — LIDOCAINE 2% (20 MG/ML) 5 ML SYRINGE
INTRAMUSCULAR | Status: DC | PRN
  Administered 2020-08-26: 40 mg via INTRAVENOUS

## 2020-08-26 MED ORDER — CEFAZOLIN SODIUM-DEXTROSE 2-4 GM/100ML-% IV SOLN
2.0000 g | INTRAVENOUS | Status: AC
  Administered 2020-08-26: 2 g via INTRAVENOUS
  Filled 2020-08-26: qty 100

## 2020-08-26 MED ORDER — EPHEDRINE SULFATE-NACL 50-0.9 MG/10ML-% IV SOSY
PREFILLED_SYRINGE | INTRAVENOUS | Status: DC | PRN
  Administered 2020-08-26 (×2): 15 mg via INTRAVENOUS

## 2020-08-26 MED ORDER — ACETAMINOPHEN 160 MG/5ML PO SOLN: 1000.0000 mg | Freq: Once | ORAL | Status: DC | PRN

## 2020-08-26 MED ORDER — ACETAMINOPHEN 325 MG PO TABS: 650.0000 mg | ORAL_TABLET | ORAL | 2 refills | Status: AC | PRN

## 2020-08-26 MED ORDER — SODIUM CHLORIDE 0.9 % IV SOLN
1.0000 g | Freq: Four times a day (QID) | INTRAVENOUS | Status: DC
  Administered 2020-08-26: 1 g via INTRAVENOUS

## 2020-08-26 MED ORDER — FENTANYL CITRATE (PF) 100 MCG/2ML IJ SOLN
INTRAMUSCULAR | Status: AC
  Filled 2020-08-26: qty 2

## 2020-08-26 MED ORDER — SODIUM CHLORIDE 0.9 % IV SOLN: INTRAVENOUS | Status: DC

## 2020-08-26 MED ORDER — BUPIVACAINE-EPINEPHRINE (PF) 0.25% -1:200000 IJ SOLN
INTRAMUSCULAR | Status: AC
  Filled 2020-08-26: qty 30

## 2020-08-26 MED ORDER — TRANEXAMIC ACID-NACL 1000-0.7 MG/100ML-% IV SOLN: 1000.0000 mg | Freq: Once | INTRAVENOUS | Status: AC

## 2020-08-26 MED ORDER — OXYCODONE HCL 5 MG PO TABS: ORAL_TABLET | ORAL | 0 refills | Status: DC

## 2020-08-26 MED ORDER — ACETAMINOPHEN 500 MG PO TABS
1000.0000 mg | ORAL_TABLET | Freq: Once | ORAL | Status: AC
  Administered 2020-08-26: 1000 mg via ORAL
  Filled 2020-08-26: qty 2

## 2020-08-26 MED ORDER — FENTANYL CITRATE (PF) 100 MCG/2ML IJ SOLN
INTRAMUSCULAR | Status: DC | PRN
  Administered 2020-08-26 (×2): 50 ug via INTRAVENOUS

## 2020-08-26 MED ORDER — PHENYLEPHRINE HCL-NACL 10-0.9 MG/250ML-% IV SOLN
INTRAVENOUS | Status: AC
  Filled 2020-08-26: qty 250

## 2020-08-26 MED ORDER — BUPIVACAINE LIPOSOME 1.3 % IJ SUSP
INTRAMUSCULAR | Status: DC | PRN
  Administered 2020-08-26: 20 mL

## 2020-08-26 MED ORDER — TRANEXAMIC ACID-NACL 1000-0.7 MG/100ML-% IV SOLN
INTRAVENOUS | Status: AC
  Administered 2020-08-26: 1000 mg via INTRAVENOUS
  Filled 2020-08-26: qty 100

## 2020-08-26 MED ORDER — OXYCODONE HCL 5 MG PO TABS
5.0000 mg | ORAL_TABLET | Freq: Once | ORAL | Status: AC | PRN
Start: 2020-08-26 — End: 2020-08-26
  Administered 2020-08-26: 5 mg via ORAL

## 2020-08-26 MED ORDER — LACTATED RINGERS IV BOLUS
500.0000 mL | Freq: Once | INTRAVENOUS | Status: AC
  Administered 2020-08-26: 500 mL via INTRAVENOUS

## 2020-08-26 MED ORDER — ACETAMINOPHEN 10 MG/ML IV SOLN: 1000.0000 mg | Freq: Once | INTRAVENOUS | Status: DC | PRN

## 2020-08-26 MED ORDER — SODIUM CHLORIDE 0.9% FLUSH
INTRAVENOUS | Status: DC | PRN
  Administered 2020-08-26: 50 mL

## 2020-08-26 MED ORDER — METHOCARBAMOL 500 MG PO TABS: 500.0000 mg | ORAL_TABLET | Freq: Four times a day (QID) | ORAL | Status: DC | PRN

## 2020-08-26 MED ORDER — CHLORHEXIDINE GLUCONATE 0.12 % MT SOLN
15.0000 mL | Freq: Once | OROMUCOSAL | Status: AC
  Administered 2020-08-26: 15 mL via OROMUCOSAL

## 2020-08-26 MED ORDER — OXYCODONE HCL 5 MG PO TABS
ORAL_TABLET | ORAL | Status: AC
  Filled 2020-08-26: qty 1

## 2020-08-26 MED ORDER — SODIUM CHLORIDE 0.9 % IV SOLN
INTRAVENOUS | Status: AC
  Filled 2020-08-26: qty 10

## 2020-08-26 MED ORDER — MIDAZOLAM HCL 5 MG/5ML IJ SOLN
INTRAMUSCULAR | Status: DC | PRN
  Administered 2020-08-26: 2 mg via INTRAVENOUS

## 2020-08-26 MED ORDER — PROPOFOL 500 MG/50ML IV EMUL
INTRAVENOUS | Status: DC | PRN
  Administered 2020-08-26: 75 ug/kg/min via INTRAVENOUS

## 2020-08-26 MED ORDER — LIDOCAINE 2% (20 MG/ML) 5 ML SYRINGE
INTRAMUSCULAR | Status: AC
  Filled 2020-08-26: qty 5

## 2020-08-26 MED ORDER — PHENYLEPHRINE 40 MCG/ML (10ML) SYRINGE FOR IV PUSH (FOR BLOOD PRESSURE SUPPORT)
PREFILLED_SYRINGE | INTRAVENOUS | Status: AC
  Filled 2020-08-26: qty 10

## 2020-08-26 MED ORDER — DEXAMETHASONE SODIUM PHOSPHATE 10 MG/ML IJ SOLN
INTRAMUSCULAR | Status: AC
  Filled 2020-08-26: qty 1

## 2020-08-26 MED ORDER — PHENYLEPHRINE 40 MCG/ML (10ML) SYRINGE FOR IV PUSH (FOR BLOOD PRESSURE SUPPORT)
PREFILLED_SYRINGE | INTRAVENOUS | Status: DC | PRN
  Administered 2020-08-26: 80 ug via INTRAVENOUS

## 2020-08-26 MED ORDER — ORAL CARE MOUTH RINSE: 15.0000 mL | Freq: Once | OROMUCOSAL | Status: AC

## 2020-08-26 MED ORDER — METHOCARBAMOL 500 MG PO TABS: 500.0000 mg | ORAL_TABLET | Freq: Four times a day (QID) | ORAL | 0 refills | Status: DC | PRN

## 2020-08-26 MED ORDER — TRANEXAMIC ACID 1000 MG/10ML IV SOLN
INTRAVENOUS | Status: DC | PRN
  Administered 2020-08-26: 2000 mg via TOPICAL

## 2020-08-26 MED ORDER — WATER FOR IRRIGATION, STERILE IR SOLN
Status: DC | PRN
  Administered 2020-08-26: 2000 mL

## 2020-08-26 MED ORDER — TRANEXAMIC ACID-NACL 1000-0.7 MG/100ML-% IV SOLN
1000.0000 mg | INTRAVENOUS | Status: AC
  Administered 2020-08-26: 1000 mg via INTRAVENOUS
  Filled 2020-08-26: qty 100

## 2020-08-26 MED ORDER — PROPOFOL 10 MG/ML IV BOLUS
INTRAVENOUS | Status: DC | PRN
  Administered 2020-08-26 (×2): 20 mg via INTRAVENOUS

## 2020-08-26 MED ORDER — METHOCARBAMOL 500 MG IVPB - SIMPLE MED: 500.0000 mg | Freq: Four times a day (QID) | INTRAVENOUS | Status: DC | PRN

## 2020-08-26 MED ORDER — PHENYLEPHRINE HCL-NACL 10-0.9 MG/250ML-% IV SOLN
INTRAVENOUS | Status: DC | PRN
  Administered 2020-08-26: 100 ug/min via INTRAVENOUS

## 2020-08-26 MED ORDER — ONDANSETRON HCL 4 MG/2ML IJ SOLN
INTRAMUSCULAR | Status: AC
  Filled 2020-08-26: qty 2

## 2020-08-26 MED ORDER — ACETAMINOPHEN 500 MG PO TABS: 1000.0000 mg | ORAL_TABLET | Freq: Once | ORAL | Status: DC | PRN

## 2020-08-26 MED ORDER — LACTATED RINGERS IV BOLUS
250.0000 mL | Freq: Once | INTRAVENOUS | Status: AC
  Administered 2020-08-26: 250 mL via INTRAVENOUS

## 2020-08-26 MED ORDER — PROPOFOL 1000 MG/100ML IV EMUL
INTRAVENOUS | Status: AC
  Filled 2020-08-26: qty 100

## 2020-08-26 MED ORDER — BUPIVACAINE IN DEXTROSE 0.75-8.25 % IT SOLN
INTRATHECAL | Status: DC | PRN
  Administered 2020-08-26: 2 mL via INTRATHECAL

## 2020-08-26 SURGICAL SUPPLY — 59 items
ATTUNE MED DOME PAT 41 KNEE (Knees) ×2 IMPLANT
ATTUNE PS FEM LT SZ 6 CEM KNEE (Femur) ×2 IMPLANT
ATTUNE PSRP INSR SZ6 7 KNEE (Insert) ×2 IMPLANT
BAG DECANTER FOR FLEXI CONT (MISCELLANEOUS) ×2 IMPLANT
BAG ZIPLOCK 12X15 (MISCELLANEOUS) ×2 IMPLANT
BASE TIBIAL ROT PLAT SZ 7 KNEE (Knees) ×1 IMPLANT
BENZOIN TINCTURE PRP APPL 2/3 (GAUZE/BANDAGES/DRESSINGS) ×2 IMPLANT
BLADE SAGITTAL 25.0X1.19X90 (BLADE) ×2 IMPLANT
BLADE SAW SGTL 13X75X1.27 (BLADE) ×2 IMPLANT
BLADE SURG 15 STRL LF DISP TIS (BLADE) ×1 IMPLANT
BLADE SURG 15 STRL SS (BLADE) ×1
BLADE SURG SZ10 CARB STEEL (BLADE) ×4 IMPLANT
BNDG ELASTIC 6X15 VLCR STRL LF (GAUZE/BANDAGES/DRESSINGS) ×2 IMPLANT
BOWL SMART MIX CTS (DISPOSABLE) ×2 IMPLANT
CEMENT HV SMART SET (Cement) ×4 IMPLANT
CLSR STERI-STRIP ANTIMIC 1/2X4 (GAUZE/BANDAGES/DRESSINGS) ×4 IMPLANT
COVER SURGICAL LIGHT HANDLE (MISCELLANEOUS) ×2 IMPLANT
COVER WAND RF STERILE (DRAPES) ×2 IMPLANT
CUFF TOURN SGL QUICK 34 (TOURNIQUET CUFF) ×1
CUFF TRNQT CYL 34X4.125X (TOURNIQUET CUFF) ×1 IMPLANT
DECANTER SPIKE VIAL GLASS SM (MISCELLANEOUS) ×4 IMPLANT
DRAPE ORTHO SPLIT 77X108 STRL (DRAPES) ×2
DRAPE SURG ORHT 6 SPLT 77X108 (DRAPES) ×2 IMPLANT
DRAPE U-SHAPE 47X51 STRL (DRAPES) ×2 IMPLANT
DRESSING AQUACEL AG SP 3.5X10 (GAUZE/BANDAGES/DRESSINGS) ×1 IMPLANT
DRSG AQUACEL AG SP 3.5X10 (GAUZE/BANDAGES/DRESSINGS) ×2
DURAPREP 26ML APPLICATOR (WOUND CARE) ×4 IMPLANT
ELECT REM PT RETURN 15FT ADLT (MISCELLANEOUS) ×2 IMPLANT
GLOVE SRG 8 PF TXTR STRL LF DI (GLOVE) ×2 IMPLANT
GLOVE SURG ORTHO LTX SZ8 (GLOVE) ×2 IMPLANT
GLOVE SURG POLYISO LF SZ7.5 (GLOVE) ×2 IMPLANT
GLOVE SURG UNDER POLY LF SZ8 (GLOVE) ×2
GOWN STRL REUS W/TWL XL LVL3 (GOWN DISPOSABLE) ×8 IMPLANT
HANDPIECE INTERPULSE COAX TIP (DISPOSABLE) ×1
HOLDER FOLEY CATH W/STRAP (MISCELLANEOUS) IMPLANT
HOOD PEEL AWAY FLYTE STAYCOOL (MISCELLANEOUS) ×2 IMPLANT
IMMOBILIZER KNEE 20 (SOFTGOODS) ×2
IMMOBILIZER KNEE 20 THIGH 36 (SOFTGOODS) ×1 IMPLANT
KIT TURNOVER KIT A (KITS) ×2 IMPLANT
MANIFOLD NEPTUNE II (INSTRUMENTS) ×2 IMPLANT
NEEDLE HYPO 22GX1.5 SAFETY (NEEDLE) ×4 IMPLANT
NS IRRIG 1000ML POUR BTL (IV SOLUTION) ×2 IMPLANT
PACK ICE MAXI GEL EZY WRAP (MISCELLANEOUS) ×2 IMPLANT
PACK TOTAL KNEE CUSTOM (KITS) ×2 IMPLANT
PIN DRILL FIX HALF THREAD (BIT) ×2 IMPLANT
PIN STEINMAN FIXATION KNEE (PIN) ×2 IMPLANT
PROTECTOR NERVE ULNAR (MISCELLANEOUS) ×2 IMPLANT
SET HNDPC FAN SPRY TIP SCT (DISPOSABLE) ×1 IMPLANT
SUT ETHIBOND NAB CT1 #1 30IN (SUTURE) ×4 IMPLANT
SUT MNCRL AB 3-0 PS2 18 (SUTURE) ×2 IMPLANT
SUT VIC AB 0 CT1 36 (SUTURE) ×2 IMPLANT
SUT VIC AB 2-0 CT1 27 (SUTURE) ×2
SUT VIC AB 2-0 CT1 TAPERPNT 27 (SUTURE) ×2 IMPLANT
SYR CONTROL 10ML LL (SYRINGE) ×6 IMPLANT
TIBIAL BASE ROT PLAT SZ 7 KNEE (Knees) ×2 IMPLANT
TOWEL OR 17X26 10 PK STRL BLUE (TOWEL DISPOSABLE) ×2 IMPLANT
TRAY FOLEY MTR SLVR 16FR STAT (SET/KITS/TRAYS/PACK) ×2 IMPLANT
WATER STERILE IRR 1000ML POUR (IV SOLUTION) ×4 IMPLANT
WRAP KNEE MAXI GEL POST OP (GAUZE/BANDAGES/DRESSINGS) ×2 IMPLANT

## 2020-08-26 NOTE — Brief Op Note (Signed)
08/26/2020  10:09 AM  PATIENT:  Wetzel Bjornstad  60 y.o. male  PRE-OPERATIVE DIAGNOSIS:  DEGENERATIVE JOINT DISEASE LEFT KNEE  POST-OPERATIVE DIAGNOSIS:  DEGENERATIVE JOINT DISEASE LEFT KNEE  PROCEDURE:  Procedure(s): TOTAL KNEE ARTHROPLASTY (Left)  SURGEON:  Surgeon(s) and Role:    Frederico Hamman, MD - Primary  PHYSICIAN ASSISTANT: Margart Sickles, PA-C  ASSISTANTS: OR staff x1   ANESTHESIA:   local, regional, spinal and IV sedation  EBL:  50 mL   BLOOD ADMINISTERED:none  DRAINS: none   LOCAL MEDICATIONS USED:  MARCAINE     SPECIMEN:  No Specimen  DISPOSITION OF SPECIMEN:  N/A  COUNTS:  YES  TOURNIQUET:   Total Tourniquet Time Documented: Thigh (Left) - 67 minutes Total: Thigh (Left) - 67 minutes   DICTATION: .Other Dictation: Dictation Number unknown  PLAN OF CARE: Discharge to home after PACU  PATIENT DISPOSITION:  PACU - hemodynamically stable.   Delay start of Pharmacological VTE agent (>24hrs) due to surgical blood loss or risk of bleeding: yes

## 2020-08-26 NOTE — Anesthesia Procedure Notes (Signed)
Date/Time: 08/26/2020 7:55 AM Performed by: Jhonnie Garner, CRNA Oxygen Delivery Method: Simple face mask

## 2020-08-26 NOTE — Discharge Instructions (Signed)

## 2020-08-26 NOTE — Anesthesia Procedure Notes (Signed)
Spinal  Patient location during procedure: OR Start time: 08/26/2020 7:50 AM End time: 08/26/2020 7:54 AM Reason for block: surgical anesthesia Staffing Performed: anesthesiologist  Anesthesiologist: Val Eagle, MD Preanesthetic Checklist Completed: patient identified, IV checked, risks and benefits discussed, surgical consent, monitors and equipment checked, pre-op evaluation and timeout performed Spinal Block Patient position: sitting Prep: DuraPrep Patient monitoring: heart rate, cardiac monitor, continuous pulse ox and blood pressure Approach: midline Location: L4-5 Injection technique: single-shot Needle Needle type: Pencan  Needle gauge: 24 G Needle length: 9 cm Assessment Sensory level: T6 Events: CSF return

## 2020-08-26 NOTE — Transfer of Care (Signed)
Immediate Anesthesia Transfer of Care Note  Patient: Jason Thomas  Procedure(s) Performed: TOTAL KNEE ARTHROPLASTY (Left Knee)  Patient Location: PACU  Anesthesia Type:Spinal  Level of Consciousness: awake and alert   Airway & Oxygen Therapy: Patient Spontanous Breathing and Patient connected to face mask oxygen  Post-op Assessment: Report given to RN and Post -op Vital signs reviewed and stable  Post vital signs: Reviewed and stable  Last Vitals:  Vitals Value Taken Time  BP 100/62 08/26/20 1013  Temp    Pulse 50 08/26/20 1015  Resp 14 08/26/20 1015  SpO2 95 % 08/26/20 1015  Vitals shown include unvalidated device data.  Last Pain:  Vitals:   08/26/20 0605  TempSrc: Oral  PainSc:       Patients Stated Pain Goal: 2 (08/26/20 0541)  Complications: No complications documented.

## 2020-08-26 NOTE — Interval H&P Note (Signed)
History and Physical Interval Note:  08/26/2020 7:36 AM  Jason Thomas  has presented today for surgery, with the diagnosis of DEGENERATIVE JOINT DISEASE LEFT KNEE.  The various methods of treatment have been discussed with the patient and family. After consideration of risks, benefits and other options for treatment, the patient has consented to  Procedure(s): TOTAL KNEE ARTHROPLASTY (Left) as a surgical intervention.  The patient's history has been reviewed, patient examined, no change in status, stable for surgery.  I have reviewed the patient's chart and labs.  Questions were answered to the patient's satisfaction.     Thera Flake

## 2020-08-26 NOTE — Anesthesia Procedure Notes (Signed)
Anesthesia Regional Block: Adductor canal block   Pre-Anesthetic Checklist: ,, timeout performed, Correct Patient, Correct Site, Correct Laterality, Correct Procedure, Correct Position, site marked, Risks and benefits discussed,  Surgical consent,  Pre-op evaluation,  At surgeon's request and post-op pain management  Laterality: Left and Lower  Prep: chloraprep       Needles:  Injection technique: Single-shot     Needle Length: 9cm  Needle Gauge: 22     Additional Needles: Arrow StimuQuik ECHO Echogenic Stimulating PNB Needle  Procedures:,,,, ultrasound used (permanent image in chart),,,,  Narrative:  Start time: 08/26/2020 7:22 AM End time: 08/26/2020 7:26 AM Injection made incrementally with aspirations every 5 mL.  Performed by: Personally  Anesthesiologist: Val Eagle, MD

## 2020-08-26 NOTE — Evaluation (Signed)
Physical Therapy Evaluation Patient Details Name: Jason Thomas MRN: [id number] DOB: [birth year] Today's Date: 08/26/2020   History of Present Illness  Pt s/p L TKR and with hx of MI, CHF, CAD, AICD and cervical fusion.  Clinical Impression  Pt s/p L TKR and presents with decreased L LE strength/ROM and post op pain limiting functional mobility.  Pt mobilizing at min guard/sup level on eval and able to negotiate stairs and perform limited HEP.  Spouse present for session and pleased with pt progress.    Follow Up Recommendations Home health PT;Follow surgeon's recommendation for DC plan and follow-up therapies    Equipment Recommendations  None recommended by PT    Recommendations for Other Services       Precautions / Restrictions Precautions Precautions: Fall Required Braces or Orthoses: Knee Immobilizer - Left Knee Immobilizer - Left: Discontinue once straight leg raise with < 10 degree lag Restrictions Weight Bearing Restrictions: No Other Position/Activity Restrictions: WBAT      Mobility  Bed Mobility Overal bed mobility: Needs Assistance Bed Mobility: Supine to Sit     Supine to sit: Supervision     General bed mobility comments: for safety but no physical assist required    Transfers Overall transfer level: Needs assistance   Transfers: Sit to/from Stand Sit to Stand: Min guard;Supervision         General transfer comment: cues for LE management and use of UEs to self assist  Ambulation/Gait Ambulation/Gait assistance: Min assist;Min guard;Supervision Gait Distance (Feet): 140 Feet Assistive device: Rolling walker (2 wheeled) Gait Pattern/deviations: Step-to pattern;Step-through pattern;Decreased step length - right;Decreased step length - left;Shuffle;Trunk flexed Gait velocity: decr   General Gait Details: cues for posture, position from RW and initial sequence  Stairs Stairs: Yes Stairs assistance: Min assist Stair Management: No rails;Step to  pattern;Backwards;With walker Number of Stairs: 4 General stair comments: 2 stairs twice bkwd with RW and cues for sequence and foot/RW placement  Wheelchair Mobility    Modified Rankin (Stroke Patients Only)       Balance Overall balance assessment: Mild deficits observed, not formally tested                                           Pertinent Vitals/Pain Pain Assessment: 0-10 Pain Score: 3  Pain Location: L knee Pain Descriptors / Indicators: Aching;Sore Pain Intervention(s): Limited activity within patient's tolerance;Monitored during session;Premedicated before session    [place] expects to be discharged to:: Private residence Living Arrangements: Spouse/significant other Available Help at Discharge: Family Type of Home: House Home Access: Stairs to enter Entrance Stairs-Rails: None Technical brewer of Steps: 2 Home Layout: One level Home Equipment: Environmental consultant - 2 wheels      Prior Function Level of Independence: Independent               Hand Dominance        Extremity/Trunk Assessment   Upper Extremity Assessment Upper Extremity Assessment: Overall WFL for tasks assessed    Lower Extremity Assessment Lower Extremity Assessment: LLE deficits/detail;RLE deficits/detail RLE Deficits / Details: Strength WFL, knee flex ~90 LLE Deficits / Details: 3/5 quad with IND SLR, AAROM at knee -5 - 40       Communication   Communication: No difficulties  Cognition Arousal/Alertness: Awake/alert Behavior During Therapy: WFL for tasks assessed/performed Overall Cognitive Status: Within Functional Limits for tasks assessed  General Comments      Exercises Total Joint Exercises Ankle Circles/Pumps: AROM;Both;15 reps;Supine Quad Sets: AROM;Both;10 reps;Supine Heel Slides: AAROM;Left;10 reps;Supine Straight Leg Raises: AAROM;AROM;Left;10 reps;Supine    Assessment/Plan    PT Assessment Patient needs continued PT services;All further PT needs can be met in the next venue of care  PT Problem List Decreased strength;Decreased range of motion;Decreased activity tolerance;Decreased balance;Decreased mobility;Decreased knowledge of use of DME;Obesity;Pain       PT Treatment Interventions DME instruction;Gait training;Functional mobility training;Stair training;Therapeutic activities;Therapeutic exercise;Patient/family education    PT Goals (Current goals can be found in the Care Plan section)  Acute Rehab PT Goals Patient Stated Goal: Regain IND PT Goal Formulation: With patient Time For Goal Achievement: 09/01/20 Potential to Achieve Goals: Good    Frequency 7X/week   Barriers to discharge        Co-evaluation               AM-PAC PT "6 Clicks" Mobility  Outcome Measure Help needed turning from your back to your side while in a flat bed without using bedrails?: A Little Help needed moving from lying on your back to sitting on the side of a flat bed without using bedrails?: A Little Help needed moving to and from a bed to a chair (including a wheelchair)?: A Little Help needed standing up from a chair using your arms (e.g., wheelchair or bedside chair)?: A Little Help needed to walk in hospital room?: A Little Help needed climbing 3-5 steps with a railing? : A Little 6 Click Score: 18    End of Session Equipment Utilized During Treatment: Gait belt Activity Tolerance: Patient tolerated treatment well Patient left: in chair;with call bell/phone within reach;with family/visitor present Nurse Communication: Mobility status PT Visit Diagnosis: Difficulty in walking, not elsewhere classified (R26.2)    Time: 5329-9242 PT Time Calculation (min) (ACUTE ONLY): 40 min   Charges:   PT Evaluation $PT Eval Low Complexity: 1 Low PT Treatments $Gait Training: 8-22 mins $Therapeutic Exercise: 8-22 mins        [place] Pager [phone] Office [phone]   Donavon Kimrey 08/26/2020, 1:24 PM

## 2020-08-27 NOTE — Op Note (Signed)
NAME: Jason Thomas, Jason Thomas MEDICAL RECORD NO: [id number] ACCOUNT NO: [id number] DATE OF BIRTH: [birth year] FACILITY: [facility] LOCATION: [facility] PHYSICIAN: W D. Carloyn Manner., MD  Operative Report   DATE OF PROCEDURE: 08/26/2020  PREOPERATIVE DIAGNOSES:  Severe osteoarthritis left knee, varus deformity and flexion contracture.  POSTOPERATIVE DIAGNOSES:  Severe osteoarthritis left knee, varus deformity and flexion contracture.  PROCEDURE:  Left total knee (DePuy Attune cemented knee), size 6 femur, size 6 tibial bearing 7 mm thickness, size 7 tibial plateau and 41 mm all poly patella.  SURGEON:  W D. Carloyn Manner., MD  ASSISTANTVincent Peyer.  ANESTHESIA:  General with a block.  TOURNIQUET TIME:  66 minutes.  DESCRIPTION OF PROCEDURE:  Supine position.  Exsanguination of the leg. Inflation of the thigh tourniquet to 350.  Straight skin incision with a medial parapatellar approach to the knee made.  We did a 10 mm 5-degree valgus cut on the femur followed by  cutting about 3 mm below the most diseased medial compartment, moderate stripping medial side of the knee due to the varus deformity was done on the MCL.   after sizing the femur, we elected on a size 6 femur, placed the all-in-1 cutting block in the  appropriate degree of external rotation, accomplishing the anterior and posterior chamfer cuts.  Small osteophytes were removed from the posterior aspect, particularly on the medial side of the knee, releasing the PCL.  Tibia was sized to be a size 7,  placement of the keel cutting guide and placement of the tibial trial, femoral trial as well after the box cut was made in the femur.  Patella was relatively thick, resecting about 9.5 mm, placing the 41 mm all poly trial.  All trials were placed.   Resolution of the varus flexion contracture was noted.  Excellent stability in all degrees of flexion was noted as well.  Cement was prepared on the back table.  Pulsatile lavage was done. We infiltrated the  subcutaneous tissues, capsular tissues with a  mixture of Exparel and Marcaine with epinephrine.  TXA topically was used as well.  Final components were inserted, tibia followed by femur, patella.  Cement was allowed to harden with the trial bearing. Trial bearing was removed.  No excess cement was  noted.  Tourniquet was released under direct vision.  No excessive bleeding was noted.  Final bearing was placed and closure was effected with #1 Ethibond, 2-0 Vicryl, and Monocryl in the skin.  Taken to recovery room in stable condition.   SHW D: 08/26/2020 9:52:16 am T: 08/27/2020 1:56:00 am  JOB: [id number]/ [id number]

## 2020-08-29 ENCOUNTER — Encounter (HOSPITAL_COMMUNITY): Payer: Self-pay | Admitting: Orthopedic Surgery

## 2020-08-30 ENCOUNTER — Encounter (HOSPITAL_COMMUNITY): Payer: Self-pay | Admitting: Orthopedic Surgery

## 2020-08-30 NOTE — Anesthesia Postprocedure Evaluation (Signed)
Anesthesia Post Note  Patient: Jason Thomas  Procedure(s) Performed: TOTAL KNEE ARTHROPLASTY (Left Knee)     Patient location during evaluation: PACU Anesthesia Type: Regional, MAC and Spinal Level of consciousness: awake and alert Pain management: pain level controlled Vital Signs Assessment: post-procedure vital signs reviewed and stable Respiratory status: spontaneous breathing, nonlabored ventilation, respiratory function stable and patient connected to nasal cannula oxygen Cardiovascular status: stable and blood pressure returned to baseline Postop Assessment: no apparent nausea or vomiting Anesthetic complications: no   No complications documented.  Last Vitals:  Vitals:   08/26/20 1500 08/26/20 1530  BP:  (!) 151/94  Pulse:  (!) 52  Resp:  15  Temp:  36.6 C  SpO2: 98% 99%    Last Pain:  Vitals:   08/26/20 1530  TempSrc:   PainSc: 0-No pain                 Jaimee Corum

## 2020-10-03 ENCOUNTER — Other Ambulatory Visit (HOSPITAL_COMMUNITY): Payer: Self-pay | Admitting: Orthopedic Surgery

## 2020-10-03 DIAGNOSIS — M79662 Pain in left lower leg: Secondary | ICD-10-CM

## 2020-10-03 DIAGNOSIS — M7989 Other specified soft tissue disorders: Secondary | ICD-10-CM

## 2020-10-04 ENCOUNTER — Other Ambulatory Visit: Payer: Self-pay

## 2020-10-04 ENCOUNTER — Emergency Department (HOSPITAL_COMMUNITY)
Admission: EM | Admit: 2020-10-04 | Discharge: 2020-10-04 | Disposition: A | Discharge status: Home / Self Care | Payer: Medicare Other | Attending: Emergency Medicine | Admitting: Emergency Medicine

## 2020-10-04 ENCOUNTER — Ambulatory Visit (HOSPITAL_BASED_OUTPATIENT_CLINIC_OR_DEPARTMENT_OTHER)
Admission: RE | Admit: 2020-10-04 | Discharge: 2020-10-04 | Disposition: A | Discharge status: Home / Self Care | Payer: Medicare Other | Source: Ambulatory Visit | Attending: Orthopedic Surgery | Admitting: Orthopedic Surgery

## 2020-10-04 ENCOUNTER — Encounter (HOSPITAL_COMMUNITY): Payer: Self-pay | Admitting: *Deleted

## 2020-10-04 DIAGNOSIS — M79662 Pain in left lower leg: Secondary | ICD-10-CM | POA: Insufficient documentation

## 2020-10-04 DIAGNOSIS — Z79899 Other long term (current) drug therapy: Secondary | ICD-10-CM | POA: Insufficient documentation

## 2020-10-04 DIAGNOSIS — I2510 Atherosclerotic heart disease of native coronary artery without angina pectoris: Secondary | ICD-10-CM | POA: Insufficient documentation

## 2020-10-04 DIAGNOSIS — I82402 Acute embolism and thrombosis of unspecified deep veins of left lower extremity: Secondary | ICD-10-CM | POA: Diagnosis not present

## 2020-10-04 DIAGNOSIS — Z87891 Personal history of nicotine dependence: Secondary | ICD-10-CM | POA: Insufficient documentation

## 2020-10-04 DIAGNOSIS — I110 Hypertensive heart disease with heart failure: Secondary | ICD-10-CM | POA: Diagnosis not present

## 2020-10-04 DIAGNOSIS — M7989 Other specified soft tissue disorders: Secondary | ICD-10-CM | POA: Insufficient documentation

## 2020-10-04 DIAGNOSIS — I5022 Chronic systolic (congestive) heart failure: Secondary | ICD-10-CM | POA: Insufficient documentation

## 2020-10-04 DIAGNOSIS — Z96652 Presence of left artificial knee joint: Secondary | ICD-10-CM | POA: Insufficient documentation

## 2020-10-04 LAB — CBC WITH DIFFERENTIAL/PLATELET
Abs Immature Granulocytes: 0.02 10*3/uL (ref 0.00–0.07)
Basophils Absolute: 0 10*3/uL (ref 0.0–0.1)
Basophils Relative: 0 %
Eosinophils Absolute: 0.1 10*3/uL (ref 0.0–0.5)
Eosinophils Relative: 2 %
HCT: 39.6 % (ref 39.0–52.0)
Hemoglobin: 12.9 g/dL — ABNORMAL LOW (ref 13.0–17.0)
Immature Granulocytes: 0 %
Lymphocytes Relative: 25 %
Lymphs Abs: 1.4 10*3/uL (ref 0.7–4.0)
MCH: 28.8 pg (ref 26.0–34.0)
MCHC: 32.6 g/dL (ref 30.0–36.0)
MCV: 88.4 fL (ref 80.0–100.0)
Monocytes Absolute: 0.6 10*3/uL (ref 0.1–1.0)
Monocytes Relative: 11 %
Neutro Abs: 3.5 10*3/uL (ref 1.7–7.7)
Neutrophils Relative %: 62 %
Platelets: 206 10*3/uL (ref 150–400)
RBC: 4.48 MIL/uL (ref 4.22–5.81)
RDW: 13.2 % (ref 11.5–15.5)
WBC: 5.6 10*3/uL (ref 4.0–10.5)
nRBC: 0 % (ref 0.0–0.2)

## 2020-10-04 LAB — BASIC METABOLIC PANEL
Anion gap: 7 (ref 5–15)
BUN: 23 mg/dL — ABNORMAL HIGH (ref 6–20)
CO2: 24 mmol/L (ref 22–32)
Calcium: 8.8 mg/dL — ABNORMAL LOW (ref 8.9–10.3)
Chloride: 106 mmol/L (ref 98–111)
Creatinine, Ser: 1.06 mg/dL (ref 0.61–1.24)
GFR, Estimated: >60 mL/min (ref >60)
Glucose, Bld: 126 mg/dL — ABNORMAL HIGH (ref 70–99)
Potassium: 4.3 mmol/L (ref 3.5–5.1)
Sodium: 137 mmol/L (ref 135–145)

## 2020-10-04 MED ORDER — RIVAROXABAN (XARELTO) VTE STARTER PACK (15 & 20 MG): ORAL_TABLET | ORAL | 0 refills | Status: DC

## 2020-10-04 MED ORDER — RIVAROXABAN 15 MG PO TABS: 15.0000 mg | ORAL_TABLET | Freq: Once | ORAL | Status: DC

## 2020-10-04 NOTE — ED Provider Notes (Signed)
[facility]-EMERGENCY DEPT Provider Note   CSN: [id number] Arrival date & time: 10/04/20  1133     History Chief Complaint  Patient presents with   Leg Swelling    US showed DVT    Jason Thomas is a 60 y.o. male.  Patient with recent knee replacement.  On the left.  He has swelling and tenderness to the left thigh.  The history is provided by the patient and medical records. No language interpreter was used.  Leg Pain Location:  Leg Time since incident:  5 days Injury: no   Leg location:  L leg Pain details:    Quality:  Aching   Radiates to:  Does not radiate   Severity:  Mild   Onset quality:  Gradual   Timing:  Constant Associated symptoms: no back pain and no fatigue       Past Medical History:  Diagnosis Date   AICD (automatic cardioverter/defibrillator) present    Arthritis 2019   CAD (coronary artery disease)    CHF (congestive heart failure) (HCC)    Chronic pain    Chronic systolic heart failure (HCC)    GERD (gastroesophageal reflux disease)    History of kidney stones    Hyperlipidemia    Hypertension    Ischemic cardiomyopathy    Myocardial infarction (HCC)    2013   Presence of permanent cardiac pacemaker     Patient Active Problem List   Diagnosis Date Noted   Cervical spondylosis with myelopathy and radiculopathy 08/10/2019   Acute CHF (congestive heart failure) (HCC) 06/15/2019   ARF (acute renal failure) (HCC) 06/15/2019   Arteriosclerosis of coronary artery 10/03/2015   Chronic cervical pain 10/03/2015   Chronic pain 10/03/2015   Degeneration of intervertebral disc of cervical region 10/03/2015   Family history of cardiac disorder 10/03/2015   Ex-smoker 10/03/2015   Acid reflux 10/03/2015   H/O acute myocardial infarction 10/03/2015   HLD (hyperlipidemia) 10/03/2015   BP (high blood pressure) 10/03/2015   Automatic implantable cardioverter-defibrillator in situ 10/03/2015   LBP (low back pain) 10/03/2015    Presence of coronary angioplasty implant and graft 10/03/2015   Cardiomyopathy (HCC) 02/07/2015   Chronic systolic heart failure (HCC) 02/07/2015   Chest pain at rest 10/23/2014   Decreased potassium in the blood 10/23/2014   Anemia, iron deficiency 10/23/2014   Chest wall pain 07/11/2012   Cervical nerve root disorder 05/30/2012   Long term current use of opiate analgesic 05/30/2012   Chronic pain associated with significant psychosocial dysfunction 05/30/2012   Pain in shoulder 04/18/2012   Abnormal CAT scan 11/06/2011   Myocardial infarction (HCC) 11/06/2011    Past Surgical History:  Procedure Laterality Date   ANTERIOR CERVICAL DECOMP/DISCECTOMY FUSION N/A 08/10/2019   Procedure: ANTERIOR CERVICAL DECOMPRESSION/DISCECTOMY FUSION, INTERBODY PROSTHESIS, PLATE/SCREWS CERVICAL THREE- CERVICAL FOUR, CERVICAL FIVE CERVICAL SIX;  Surgeon: Tressie Stalker, MD;  Location: [facility] OR;  Service: Neurosurgery;  Laterality: N/A;  anterior   CARDIAC CATHETERIZATION     CORONARY ANGIOPLASTY     INSERT / REPLACE / REMOVE PACEMAKER     neck fusion     TOTAL KNEE ARTHROPLASTY Left 08/26/2020   Procedure: TOTAL KNEE ARTHROPLASTY;  Surgeon: Frederico Hamman, MD;  Location: [facility] ORS;  Service: Orthopedics;  Laterality: Left;       Family History  Problem Relation Age of Onset   CAD Father     Social History   Tobacco Use   Smoking status: Former Smoker  Years: 15.00   Smokeless tobacco: Never Used   Tobacco comment: quit in 2013  Vaping Use   Vaping Use: Never used  Substance Use Topics   Alcohol use: Never   Drug use: Never    Home Medications Prior to Admission medications   Medication Sig Start Date End Date Taking? Authorizing Provider  RIVAROXABAN Carlena Hurl) VTE STARTER PACK (15 & 20 MG) Follow package directions: Take one 15mg  tablet by mouth twice a day. On day 22, switch to one 20mg  tablet once a day. Take with food. 10/04/20  Yes , MD  acetaminophen (TYLENOL) 325 MG  tablet Take 2 tablets (650 mg total) by mouth every 4 (four) hours as needed. 08/26/20 08/26/21  Chadwell, 08/28/20, PA-C  amiodarone (PACERONE) 200 MG tablet Take 100 mg by mouth daily.     [provider]  atorvastatin (LIPITOR) 80 MG tablet Take 80 mg by mouth daily.     [provider]  buPROPion (WELLBUTRIN SR) 150 MG 12 hr tablet Take 150 mg by mouth 2 (two) times daily.    [provider]  carvedilol (COREG) 12.5 MG tablet Take 12.5 mg by mouth in the morning and at bedtime.     [provider]  Coenzyme Q10 (COQ10) 100 MG CAPS Take 100 mg by mouth daily.    [provider]  docusate sodium (COLACE) 100 MG capsule Take 100 mg by mouth daily.    [provider]  ezetimibe (ZETIA) 10 MG tablet Take 10 mg by mouth daily.    [provider]  famotidine (PEPCID) 40 MG tablet Take 40 mg by mouth at bedtime.    [provider]  furosemide (LASIX) 20 MG tablet Take 20 mg by mouth daily as needed for edema.    [provider]  furosemide (LASIX) 40 MG tablet Take 1 tablet (40 mg total) by mouth daily. Patient not taking: Reported on 08/04/2020 06/17/19   Elgergawy, 10/04/2020, MD  lisinopril (ZESTRIL) 5 MG tablet Take 1 tablet (5 mg total) by mouth in the morning and at bedtime. Resume in 1 week Patient taking differently: Take 5 mg by mouth in the morning and at bedtime. 06/23/19   Elgergawy, Leana Roe, MD  methadone (DOLOPHINE) 10 MG/5ML solution Take 20 mg by mouth daily.    [provider]  methocarbamol (ROBAXIN) 500 MG tablet Take 1 tablet (500 mg total) by mouth every 6 (six) hours as needed for muscle spasms. 08/26/20   Chadwell, Leana Roe, PA-C  Multiple Vitamins-Minerals (MULTIVITAMIN WITH MINERALS) tablet Take 1 tablet by mouth daily.    [provider]  nitroGLYCERIN (NITROSTAT) 0.4 MG SL tablet Place 0.4 mg under the tongue every 5 (five) minutes as needed for chest pain.     [provider]   Omega-3 Fatty Acids (FISH OIL) 1000 MG CAPS Take 1,000 mg by mouth daily.    [provider]  oxaprozin (DAYPRO) 600 MG tablet Take 600 mg by mouth daily as needed (pain).    [provider]  oxyCODONE (OXY IR/ROXICODONE) 5 MG immediate release tablet Take one tab po q4-6hrs prn pain, may need 1-2 first couple days, max 8 tabs in 24hrs 08/26/20   Chadwell, Ivin Booty, PA-C  pantoprazole (PROTONIX) 40 MG tablet Take 40 mg by mouth daily.    [provider]  spironolactone (ALDACTONE) 25 MG tablet Take 25 mg by mouth daily. 05/02/19   [provider]    Allergies    Prednisone  Review  of Systems   Review of Systems  Constitutional:  Negative for appetite change and fatigue.  HENT:  Negative for congestion, ear discharge and sinus pressure.   Eyes:  Negative for discharge.  Respiratory:  Negative for cough.   Cardiovascular:  Negative for chest pain.  Gastrointestinal:  Negative for abdominal pain and diarrhea.  Genitourinary:  Negative for frequency and hematuria.  Musculoskeletal:  Negative for back pain.       Swelling left leg  Skin:  Negative for rash.  Neurological:  Negative for seizures and headaches.  Psychiatric/Behavioral:  Negative for hallucinations.    Physical Exam Updated Vital Signs BP (!) 140/105 (BP Location: Right Arm)   Pulse 74   Temp 98.2 F (36.8 C) (Oral)   Resp 10   Ht 6' (1.829 m)   Wt 120.7 kg   SpO2 100%   BMI 36.08 kg/m   Physical Exam Vitals and nursing note reviewed.  Constitutional:      Appearance: He is well-developed.  HENT:     Head: Normocephalic.     Nose: Nose normal.  Eyes:     General: No scleral icterus.    Conjunctiva/sclera: Conjunctivae normal.  Neck:     Thyroid: No thyromegaly.  Cardiovascular:     Rate and Rhythm: Normal rate and regular rhythm.     Heart sounds: No murmur heard.   No friction rub. No gallop.  Pulmonary:     Breath sounds: No stridor. No wheezing or rales.  Chest:      Chest wall: No tenderness.  Abdominal:     General: There is no distension.     Tenderness: There is no abdominal tenderness. There is no rebound.  Musculoskeletal:        General: Normal range of motion.     Cervical back: Neck supple.     Comments: Swelling tenderness left thigh  Lymphadenopathy:     Cervical: No cervical adenopathy.  Skin:    Findings: No erythema or rash.  Neurological:     Mental Status: He is alert and oriented to person, place, and time.     Motor: No abnormal muscle tone.     Coordination: Coordination normal.  Psychiatric:        Behavior: Behavior normal.    ED Results / Procedures / Treatments   Labs (all labs ordered are listed, but only abnormal results are displayed) Labs Reviewed  BASIC METABOLIC PANEL - Abnormal; Notable for the following components:      Result Value   Glucose, Bld 126 (*)    BUN 23 (*)    Calcium 8.8 (*)    All other components within normal limits  CBC WITH DIFFERENTIAL/PLATELET - Abnormal; Notable for the following components:   Hemoglobin 12.9 (*)    All other components within normal limits    EKG None  Radiology VAS [place] LOWER EXTREMITY VENOUS (DVT)  Result Date: 10/04/2020  Lower Venous DVT Study Patient Name:  Juanmiguel Sekula  Date of Exam:   10/04/2020 Medical Rec #: [id number]   Accession #:    [id number] Date of Birth: [birth year]  Patient Gender: M Patient Age:   59Y Exam Location:  [facility] Procedure:      VAS [place] LOWER EXTREMITY VENOUS (DVT) Referring Phys: [id number] DANIEL CAFFREY --------------------------------------------------------------------------------  Indications: Swelling.  Risk Factors: Surgery. Limitations: Poor ultrasound/tissue interface and body habitus. Comparison Study: No prior studies. Performing Technologist: Chanda Busing RVT  Examination Guidelines: A complete evaluation includes B-mode imaging,  spectral Doppler, color Doppler, and power Doppler as needed of all accessible portions of each  vessel. Bilateral testing is considered an integral part of a complete examination. Limited examinations for reoccurring indications may be performed as noted. The reflux portion of the exam is performed with the patient in reverse Trendelenburg.  +-----+---------------+---------+-----------+----------+--------------+ RIGHTCompressibilityPhasicitySpontaneityPropertiesThrombus Aging +-----+---------------+---------+-----------+----------+--------------+ CFV  Full           Yes      Yes                                 +-----+---------------+---------+-----------+----------+--------------+   +---------+---------------+---------+-----------+----------+-----------------+ LEFT     CompressibilityPhasicitySpontaneityPropertiesThrombus Aging    +---------+---------------+---------+-----------+----------+-----------------+ CFV      Partial        No       No                   Age Indeterminate +---------+---------------+---------+-----------+----------+-----------------+ SFJ      Full                                                           +---------+---------------+---------+-----------+----------+-----------------+ FV Prox  Full                                                           +---------+---------------+---------+-----------+----------+-----------------+ FV Mid   Full                                                           +---------+---------------+---------+-----------+----------+-----------------+ FV DistalFull                                                           +---------+---------------+---------+-----------+----------+-----------------+ PFV      Partial        Yes      Yes                  Age Indeterminate +---------+---------------+---------+-----------+----------+-----------------+ POP      Full           Yes      Yes                                     +---------+---------------+---------+-----------+----------+-----------------+ PTV      Full                                                           +---------+---------------+---------+-----------+----------+-----------------+ PERO     Full                                                           +---------+---------------+---------+-----------+----------+-----------------+  EIV                     Yes      Yes                                    +---------+---------------+---------+-----------+----------+-----------------+     Summary: RIGHT: - No evidence of common femoral vein obstruction.  LEFT: - Findings consistent with age indeterminate deep vein thrombosis involving the left common femoral vein, and left proximal profunda vein. - No cystic structure found in the popliteal fossa.  *See table(s) above for measurements and observations. Electronically signed by Fabienne Bruns MD on 10/04/2020 at 4:25:54 PM.    Final     Procedures Procedures   Medications Ordered in ED Medications - No data to display  ED Course  I have reviewed the triage vital signs and the nursing notes.  Pertinent labs & imaging results that were available during my care of the patient were reviewed by me and considered in my medical decision making (see chart for details).    MDM Rules/Calculators/A&P                          Ultrasound shows DVT of the left thigh.  He will be placed on Xarelto and follow-up with PCP Final Clinical Impression(s) / ED Diagnoses Final diagnoses:  Acute deep vein thrombosis (DVT) of left lower extremity, unspecified vein (HCC)    Rx / DC Orders ED Discharge Orders          Ordered    RIVAROXABAN (XARELTO) VTE STARTER PACK (15 & 20 MG)        10/04/20 1847             Bethann Berkshire, MD 10/06/20 1105

## 2020-10-04 NOTE — ED Notes (Signed)
Security contacted to assist pt with POV that was valet

## 2020-10-04 NOTE — ED Provider Notes (Signed)
Emergency Medicine Provider Triage Evaluation Note  Jason Thomas , a 60 y.o. male  was evaluated in triage.  Pt complains of DVT.  Patient reports he had an outpatient ultrasound done today, was found to be positive for DVT in the left thigh.  Patient had a left total knee replacement on April 29, and was sent by his orthopedist for DVT study due to worsening swelling in the left leg.  Denies chest pain or shortness of breath  Review of Systems  Positive: Leg swelling Negative: Chest pain, shortness of breath  Physical Exam  BP (!) 151/94 (BP Location: Right Arm)   Pulse 69   Temp 98.2 F (36.8 C) (Oral)   Resp 16   Ht 6' (1.829 m)   Wt 120.7 kg   SpO2 97%   BMI 36.08 kg/m  Gen:   Awake, no distress   Resp:  Normal effort  MSK:   Moves extremities without difficulty  Other:  Some swelling and tenderness present in the left thigh  Medical Decision Making  Medically screening exam initiated at 12:03 PM.  Appropriate orders placed.  Jason Thomas was informed that the remainder of the evaluation will be completed by another provider, this initial triage assessment does not replace that evaluation, and the importance of remaining in the ED until their evaluation is complete.  Positive DVT study, patient will need lab work to be started on anticoagulation   Jason Thomas 10/04/20 1205    Jason Kaplan, MD 10/04/20 1546

## 2020-10-04 NOTE — ED Triage Notes (Signed)
Pt has ultrasound today showing blood clot in left upper thigh. Pt reports left leg swelling for the past few weeks.

## 2020-10-04 NOTE — Progress Notes (Signed)
Left lower extremity venous duplex has been completed. Preliminary results can be found in CV Proc through chart review.  Results were given to Josh at Dr. Candise Bowens office.  10/04/20 11:09 AM Olen Cordial RVT

## 2020-10-04 NOTE — ED Notes (Signed)
Pt verbalized understanding of d/c, medication, and follow up care. Ambulatory with cane. A&Ox4

## 2020-10-04 NOTE — Discharge Instructions (Signed)
Follow-up with your doctor next week for recheck 

## 2020-11-23 IMAGING — CT CT ANGIO CHEST
2 of 6 series · 19 of 36 positions shown · IV contrast (omnipaque)
Comparison: Radiography same day.  CT angiography 09/13/2011.

CLINICAL DATA: Shortness of breath.  Swollen feet and ankles.

EXAM:
CT ANGIOGRAPHY CHEST WITH CONTRAST
TECHNIQUE: Multidetector CT imaging of the chest was performed using the
standard protocol during bolus administration of intravenous
contrast. Multiplanar CT image reconstructions and MIPs were
obtained to evaluate the vascular anatomy.
CONTRAST:  60mL OMNIPAQUE IOHEXOL 350 MG/ML SOLN

[Series 7: pe thins · axial · 0.96mm/px · z∈[+1112,+1331]mm · 18 of 350 slices shown]
[im 18/350  lung]
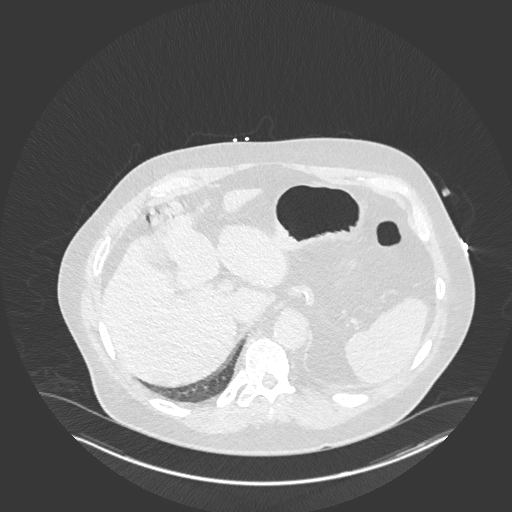
[im 35/350  mediastinal]
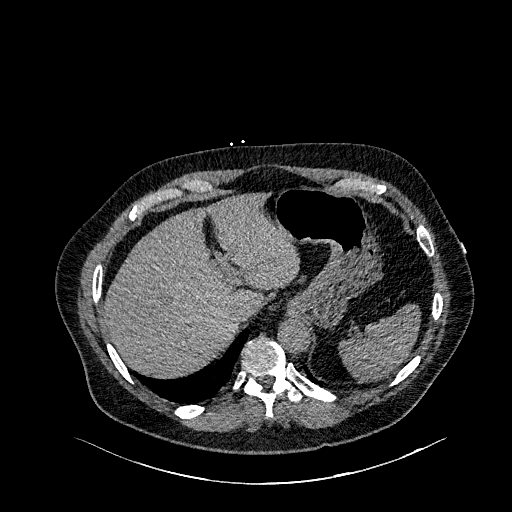
[im 53/350  lung]
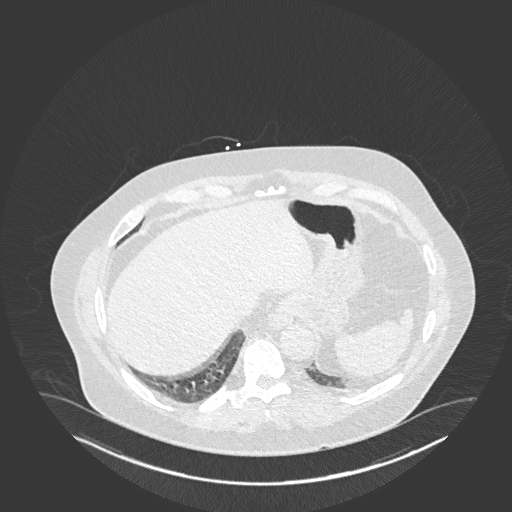
[im 70/350  mediastinal]
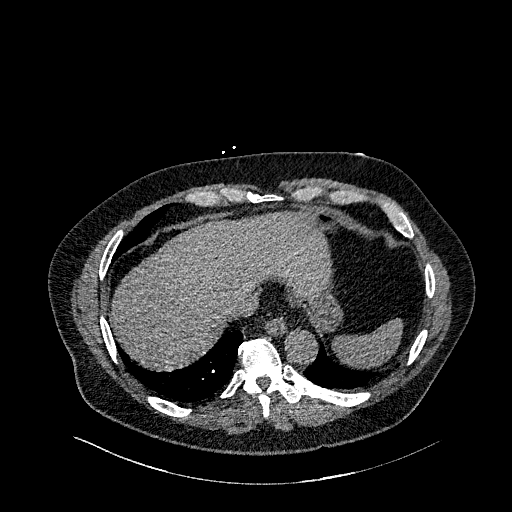
[im 88/350  lung]
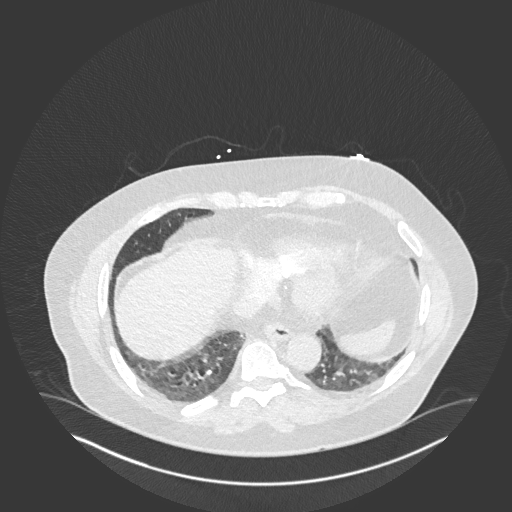
[im 105/350  mediastinal]
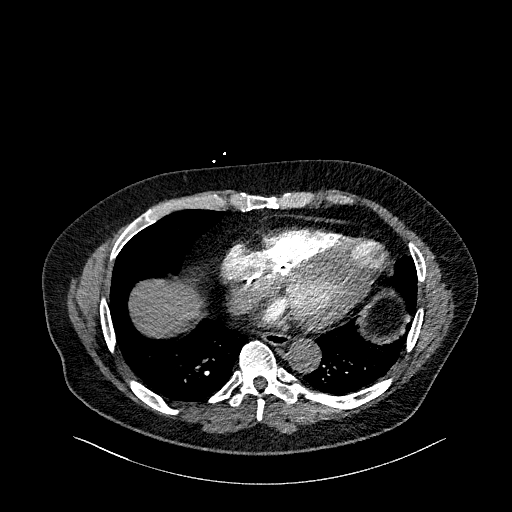
[im 123/350  lung]
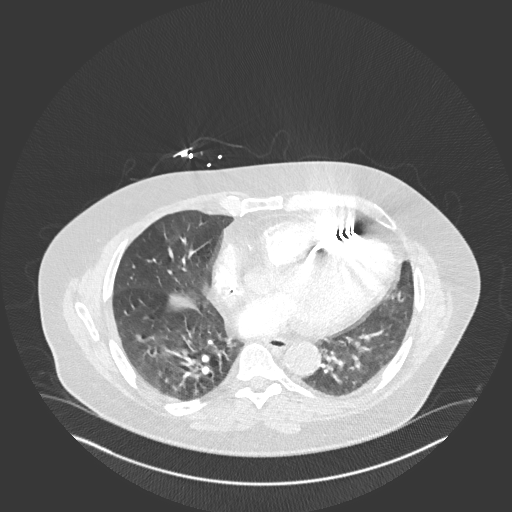
[im 140/350  mediastinal]
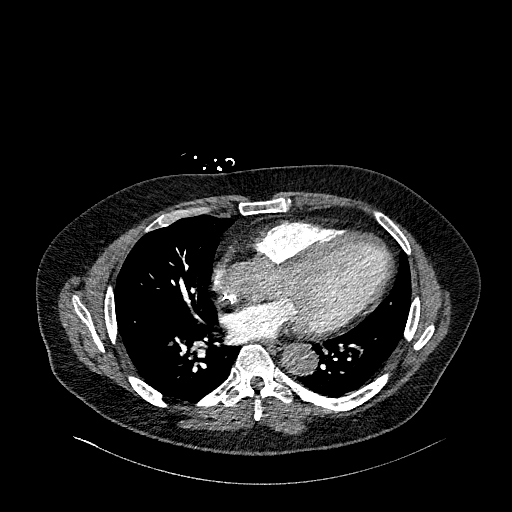
[im 158/350  lung]
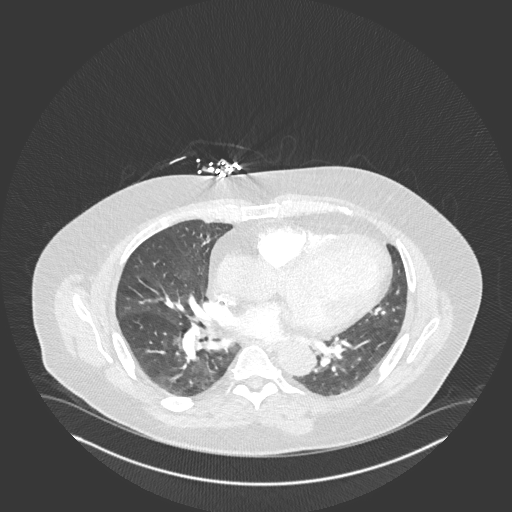
[im 192/350  mediastinal]
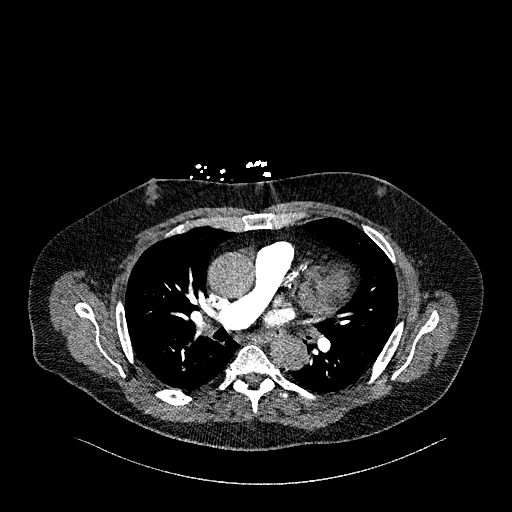
[im 210/350  lung]
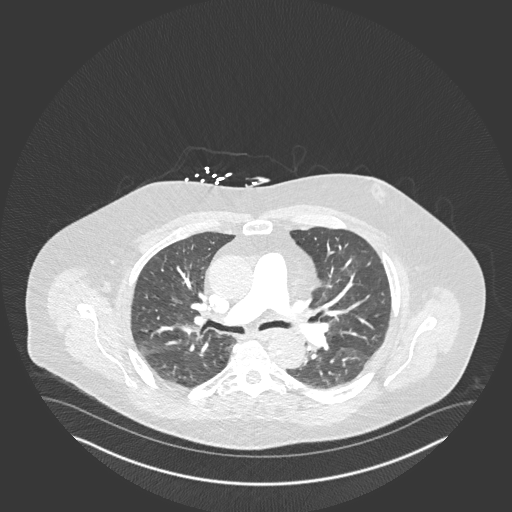
[im 227/350  mediastinal]
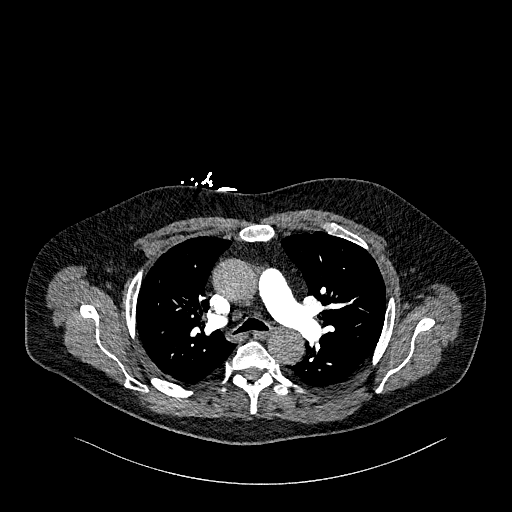
[im 245/350  lung]
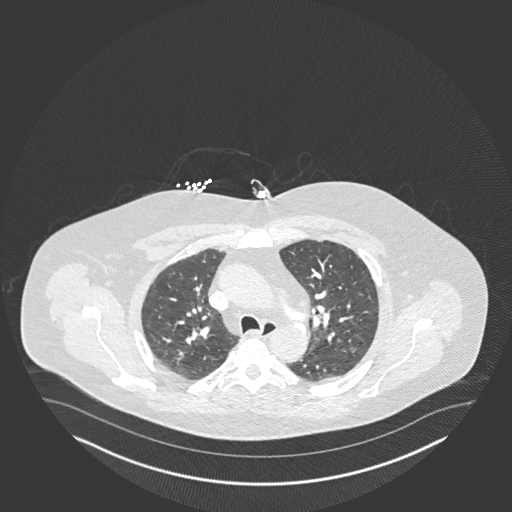
[im 262/350  mediastinal]
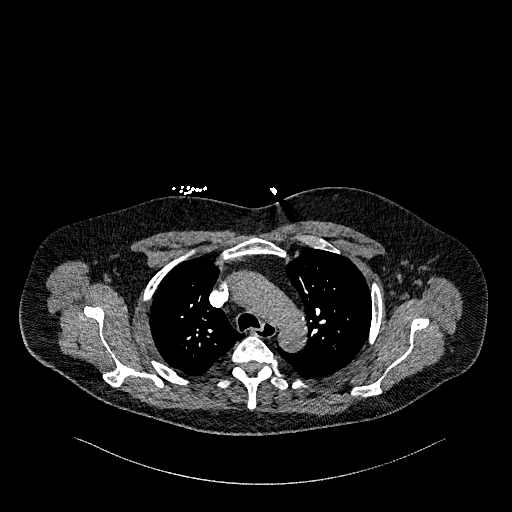
[im 280/350  lung]
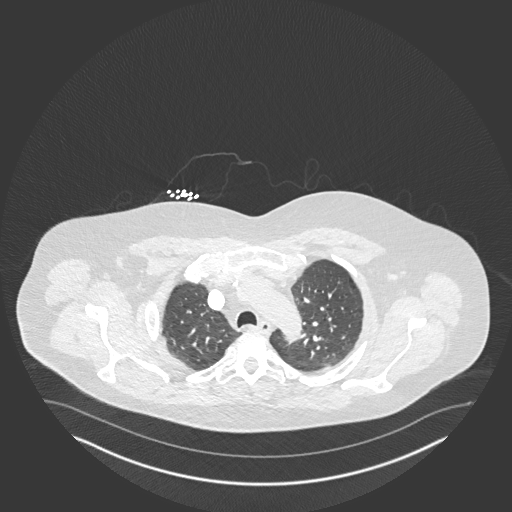
[im 297/350  mediastinal]
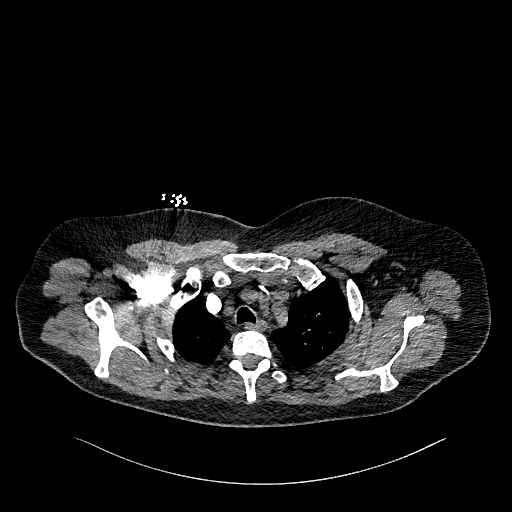
[im 315/350  lung]
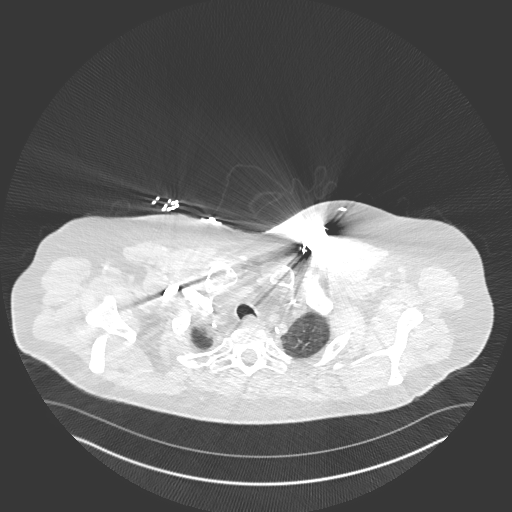
[im 332/350  mediastinal]
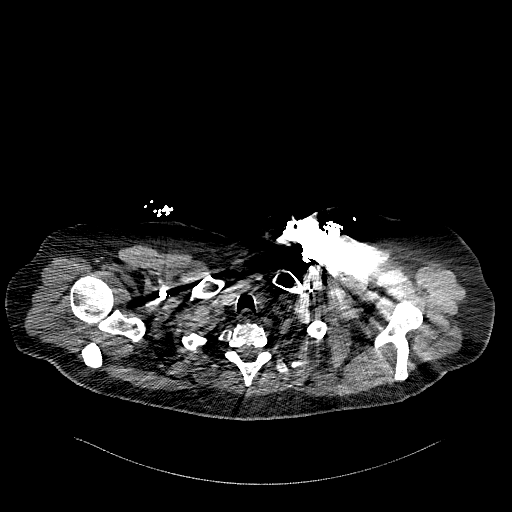

[Series 8: pe 2mm cor · coronal · 0.49mm/px · 1 of 138 slices shown]
[im 69/138  mediastinal]
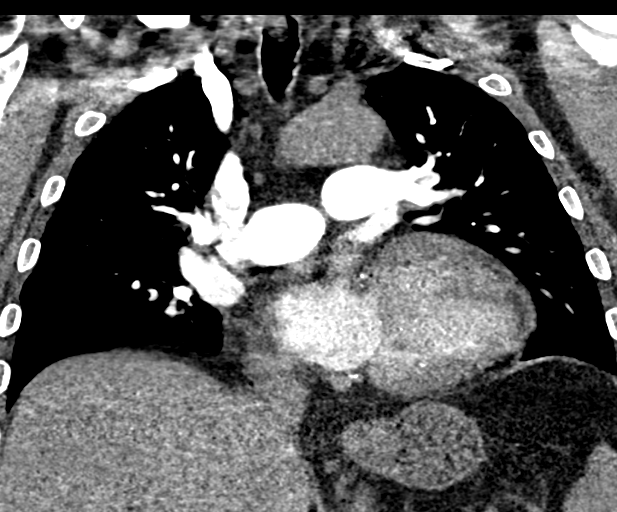

[19 of 36 positions shown; findings below may reference images not displayed]

FINDINGS: Cardiovascular: The study suffers from some breathing motion. There
are no visible pulmonary emboli. Heart is mildly enlarged. Pacemaker
in place. There is coronary artery calcification. There is aortic
atherosclerotic calcification.

Mediastinum/Nodes: No hilar or mediastinal mass or lymphadenopathy.

Lungs/Pleura: The lungs are clear. No infiltrate, collapse or
effusion.

Upper Abdomen: Negative

Musculoskeletal: Ordinary spinal degenerative changes.

Review of the MIP images confirms the above findings.
IMPRESSION: No pulmonary emboli or other acute chest pathology.

Pacemaker in place. Cardiomegaly. Coronary artery calcification.
Aortic atherosclerosis.

## 2021-03-08 NOTE — Progress Notes (Signed)
Sent message, via epic in basket, requesting orders in epic from surgeon.  

## 2021-03-15 ENCOUNTER — Ambulatory Visit: Payer: Self-pay | Admitting: Physician Assistant

## 2021-03-15 DIAGNOSIS — M1711 Unilateral primary osteoarthritis, right knee: Secondary | ICD-10-CM

## 2021-03-15 NOTE — H&P (Signed)
TOTAL KNEE ADMISSION H&P  Patient is being admitted for right total knee arthroplasty.  Subjective:  Chief Complaint:right knee pain.  HPI: Jason Thomas, 60 y.o. male, has a history of pain and functional disability in the right knee due to arthritis and has failed non-surgical conservative treatments for greater than 12 weeks to includeNSAID's and/or analgesics, corticosteriod injections, use of assistive devices, and activity modification.  Onset of symptoms was gradual, starting 5 years ago with gradually worsening course since that time. The patient noted no past surgery on the right knee(s).  Patient currently rates pain in the right knee(s) at 8 out of 10 with activity. Patient has night pain, worsening of pain with activity and weight bearing, pain that interferes with activities of daily living, pain with passive range of motion, crepitus, and joint swelling.  Patient has evidence of periarticular osteophytes and joint space narrowing by imaging studies. There is no active infection.  Patient Active Problem List   Diagnosis Date Noted   Cervical spondylosis with myelopathy and radiculopathy 08/10/2019   Acute CHF (congestive heart failure) ([place]) 06/15/2019   ARF (acute renal failure) ([place]) 06/15/2019   Arteriosclerosis of coronary artery 10/03/2015   Chronic cervical pain 10/03/2015   Chronic pain 10/03/2015   Degeneration of intervertebral disc of cervical region 10/03/2015   Family history of cardiac disorder 10/03/2015   Ex-smoker 10/03/2015   Acid reflux 10/03/2015   H/O acute myocardial infarction 10/03/2015   HLD (hyperlipidemia) 10/03/2015   BP (high blood pressure) 10/03/2015   Automatic implantable cardioverter-defibrillator in situ 10/03/2015   LBP (low back pain) 10/03/2015   Presence of coronary angioplasty implant and graft 10/03/2015   Cardiomyopathy ([place]) 123XX123   Chronic systolic heart failure ([place]) 02/07/2015   Chest pain at rest 10/23/2014   Decreased  potassium in the blood 10/23/2014   Anemia, iron deficiency 10/23/2014   Chest wall pain 07/11/2012   Cervical nerve root disorder 05/30/2012   Long term current use of opiate analgesic 05/30/2012   Chronic pain associated with significant psychosocial dysfunction 05/30/2012   Pain in shoulder 04/18/2012   Abnormal CAT scan 11/06/2011   Myocardial infarction ([place]) 11/06/2011   Past Medical History:  Diagnosis Date   AICD (automatic cardioverter/defibrillator) present    Arthritis 2019   CAD (coronary artery disease)    CHF (congestive heart failure) (HCC)    Chronic pain    Chronic systolic heart failure (HCC)    GERD (gastroesophageal reflux disease)    History of kidney stones    Hyperlipidemia    Hypertension    Ischemic cardiomyopathy    Myocardial infarction ([place])    2013   Presence of permanent cardiac pacemaker     Past Surgical History:  Procedure Laterality Date   ANTERIOR CERVICAL DECOMP/DISCECTOMY FUSION N/A 08/10/2019   Procedure: ANTERIOR CERVICAL DECOMPRESSION/DISCECTOMY FUSION, INTERBODY PROSTHESIS, PLATE/SCREWS CERVICAL THREE- CERVICAL FOUR, CERVICAL FIVE CERVICAL SIX;  Surgeon: Newman Pies, MD;  Location: [facility];  Service: Neurosurgery;  Laterality: N/A;  anterior   CARDIAC CATHETERIZATION     CORONARY ANGIOPLASTY     INSERT / REPLACE / REMOVE PACEMAKER     neck fusion     TOTAL KNEE ARTHROPLASTY Left 08/26/2020   Procedure: TOTAL KNEE ARTHROPLASTY;  Surgeon: Earlie Server, MD;  Location: [facility];  Service: Orthopedics;  Laterality: Left;    Current Outpatient Medications  Medication Sig Dispense Refill Last Dose   acetaminophen (TYLENOL) 325 MG tablet Take 2 tablets (650 mg total) by mouth every 4 (  four) hours as needed. 100 tablet 2    amiodarone (PACERONE) 200 MG tablet Take 100 mg by mouth daily.       atorvastatin (LIPITOR) 80 MG tablet Take 80 mg by mouth daily.       buPROPion (WELLBUTRIN SR) 150 MG 12 hr tablet Take 150 mg by mouth 2 (two) times  daily.      carvedilol (COREG) 12.5 MG tablet Take 12.5 mg by mouth in the morning and at bedtime.       Coenzyme Q10 (COQ10) 100 MG CAPS Take 100 mg by mouth daily.      docusate sodium (COLACE) 100 MG capsule Take 100 mg by mouth daily.      ezetimibe (ZETIA) 10 MG tablet Take 10 mg by mouth daily.      famotidine (PEPCID) 40 MG tablet Take 40 mg by mouth at bedtime.      furosemide (LASIX) 20 MG tablet Take 20 mg by mouth daily as needed for edema.      furosemide (LASIX) 40 MG tablet Take 1 tablet (40 mg total) by mouth daily. (Patient not taking: Reported on 08/04/2020) 30 tablet 0    lisinopril (ZESTRIL) 5 MG tablet Take 1 tablet (5 mg total) by mouth in the morning and at bedtime. Resume in 1 week (Patient taking differently: Take 5 mg by mouth in the morning and at bedtime.)      methadone (DOLOPHINE) 10 MG/5ML solution Take 20 mg by mouth daily.      methocarbamol (ROBAXIN) 500 MG tablet Take 1 tablet (500 mg total) by mouth every 6 (six) hours as needed for muscle spasms. 60 tablet 0    Multiple Vitamins-Minerals (MULTIVITAMIN WITH MINERALS) tablet Take 1 tablet by mouth daily.      nitroGLYCERIN (NITROSTAT) 0.4 MG SL tablet Place 0.4 mg under the tongue every 5 (five) minutes as needed for chest pain.       Omega-3 Fatty Acids (FISH OIL) 1000 MG CAPS Take 1,000 mg by mouth daily.      oxaprozin (DAYPRO) 600 MG tablet Take 600 mg by mouth daily as needed (pain).      oxyCODONE (OXY IR/ROXICODONE) 5 MG immediate release tablet Take one tab po q4-6hrs prn pain, may need 1-2 first couple days, max 8 tabs in 24hrs 40 tablet 0    pantoprazole (PROTONIX) 40 MG tablet Take 40 mg by mouth daily.      RIVAROXABAN (XARELTO) VTE STARTER PACK (15 & 20 MG) Follow package directions: Take one 15mg  tablet by mouth twice a day. On day 22, switch to one 20mg  tablet once a day. Take with food. 51 each 0    spironolactone (ALDACTONE) 25 MG tablet Take 25 mg by mouth daily.      No current  facility-administered medications for this visit.   Allergies  Allergen Reactions   Prednisone Other (See Comments)    Significant increase in violent temper,which led to an arrest for disorderly conduct.    Social History   Tobacco Use   Smoking status: Former    Years: 15.00    Types: Cigarettes   Smokeless tobacco: Never   Tobacco comments:    quit in 2013  Substance Use Topics   Alcohol use: Never    Family History  Problem Relation Age of Onset   CAD Father      Review of Systems  Cardiovascular:  Positive for chest pain and leg swelling.  Gastrointestinal:  Positive for constipation.  Genitourinary:  Positive for frequency.  All other systems reviewed and are negative.  Objective:  Physical Exam Constitutional:      General: He is not in acute distress.    Appearance: Normal appearance.  HENT:     Head: Normocephalic and atraumatic.  Eyes:     Extraocular Movements: Extraocular movements intact.     Pupils: Pupils are equal, round, and reactive to light.  Cardiovascular:     Rate and Rhythm: Normal rate and regular rhythm.     Pulses: Normal pulses.     Heart sounds: Normal heart sounds.  Pulmonary:     Effort: Pulmonary effort is normal. No respiratory distress.     Breath sounds: Normal breath sounds.  Abdominal:     General: Abdomen is flat. Bowel sounds are normal. There is no distension.     Palpations: Abdomen is soft.     Tenderness: There is no abdominal tenderness.  Musculoskeletal:     Cervical back: Normal range of motion and neck supple.     Comments: Examination of the right lower extremity again shows he is neurovascularly intact.  He has intact dorsiflexion and plantarflexion with the ankle.  There is no calf tenderness to palpation.  Decent range of motion with the right knee.  Joint line tenderness.  Stable to varus and valgus testing.  Lymphadenopathy:     Cervical: No cervical adenopathy.  Skin:    General: Skin is warm and dry.      Findings: No erythema or rash.  Neurological:     General: No focal deficit present.     Mental Status: He is alert and oriented to person, place, and time.  Psychiatric:        Mood and Affect: Mood normal.        Behavior: Behavior normal.    Vital signs in last 24 hours: @VSRANGES @  Labs:   Estimated body mass index is 36.08 kg/m as calculated from the following:   Height as of 10/04/20: 6' (1.829 m).   Weight as of 10/04/20: 120.7 kg.   Imaging Review Plain radiographs demonstrate moderate degenerative joint disease of the right knee(s). The overall alignment ismild varus. The bone quality appears to be good for age and reported activity level.      Assessment/Plan:  End stage arthritis, right knee   The patient history, physical examination, clinical judgment of the provider and imaging studies are consistent with end stage degenerative joint disease of the right knee(s) and total knee arthroplasty is deemed medically necessary. The treatment options including medical management, injection therapy arthroscopy and arthroplasty were discussed at length. The risks and benefits of total knee arthroplasty were presented and reviewed. The risks due to aseptic loosening, infection, stiffness, patella tracking problems, thromboembolic complications and other imponderables were discussed. The patient acknowledged the explanation, agreed to proceed with the plan and consent was signed. Patient is being admitted for inpatient treatment for surgery, pain control, PT, OT, prophylactic antibiotics, VTE prophylaxis, progressive ambulation and ADL's and discharge planning. The patient is planning to be discharged home with home health services    Anticipated LOS equal to or greater than 2 midnights due to - Age 53 and older with one or more of the following:  - Obesity  - Expected need for hospital services (PT, OT, Nursing) required for safe  discharge  - Anticipated need for postoperative  skilled nursing care or inpatient rehab  - Active co-morbidities: Chronic pain requiring opiods, Coronary Artery Disease, Heart Attack, and  DVT/VTE OR   - Unanticipated findings during/Post Surgery: None  - Patient is a high risk of re-admission due to: None

## 2021-03-15 NOTE — H&P (View-Only) (Signed)
TOTAL KNEE ADMISSION H&P  Patient is being admitted for right total knee arthroplasty.  Subjective:  Chief Complaint:right knee pain.  HPI: Jason Thomas, 60 y.o. male, has a history of pain and functional disability in the right knee due to arthritis and has failed non-surgical conservative treatments for greater than 12 weeks to includeNSAID's and/or analgesics, corticosteriod injections, use of assistive devices, and activity modification.  Onset of symptoms was gradual, starting 5 years ago with gradually worsening course since that time. The patient noted no past surgery on the right knee(s).  Patient currently rates pain in the right knee(s) at 8 out of 10 with activity. Patient has night pain, worsening of pain with activity and weight bearing, pain that interferes with activities of daily living, pain with passive range of motion, crepitus, and joint swelling.  Patient has evidence of periarticular osteophytes and joint space narrowing by imaging studies. There is no active infection.  Patient Active Problem List   Diagnosis Date Noted   Cervical spondylosis with myelopathy and radiculopathy 08/10/2019   Acute CHF (congestive heart failure) ([place]) 06/15/2019   ARF (acute renal failure) ([place]) 06/15/2019   Arteriosclerosis of coronary artery 10/03/2015   Chronic cervical pain 10/03/2015   Chronic pain 10/03/2015   Degeneration of intervertebral disc of cervical region 10/03/2015   Family history of cardiac disorder 10/03/2015   Ex-smoker 10/03/2015   Acid reflux 10/03/2015   H/O acute myocardial infarction 10/03/2015   HLD (hyperlipidemia) 10/03/2015   BP (high blood pressure) 10/03/2015   Automatic implantable cardioverter-defibrillator in situ 10/03/2015   LBP (low back pain) 10/03/2015   Presence of coronary angioplasty implant and graft 10/03/2015   Cardiomyopathy ([place]) 123XX123   Chronic systolic heart failure ([place]) 02/07/2015   Chest pain at rest 10/23/2014   Decreased  potassium in the blood 10/23/2014   Anemia, iron deficiency 10/23/2014   Chest wall pain 07/11/2012   Cervical nerve root disorder 05/30/2012   Long term current use of opiate analgesic 05/30/2012   Chronic pain associated with significant psychosocial dysfunction 05/30/2012   Pain in shoulder 04/18/2012   Abnormal CAT scan 11/06/2011   Myocardial infarction ([place]) 11/06/2011   Past Medical History:  Diagnosis Date   AICD (automatic cardioverter/defibrillator) present    Arthritis 2019   CAD (coronary artery disease)    CHF (congestive heart failure) (HCC)    Chronic pain    Chronic systolic heart failure (HCC)    GERD (gastroesophageal reflux disease)    History of kidney stones    Hyperlipidemia    Hypertension    Ischemic cardiomyopathy    Myocardial infarction ([place])    2013   Presence of permanent cardiac pacemaker     Past Surgical History:  Procedure Laterality Date   ANTERIOR CERVICAL DECOMP/DISCECTOMY FUSION N/A 08/10/2019   Procedure: ANTERIOR CERVICAL DECOMPRESSION/DISCECTOMY FUSION, INTERBODY PROSTHESIS, PLATE/SCREWS CERVICAL THREE- CERVICAL FOUR, CERVICAL FIVE CERVICAL SIX;  Surgeon: Newman Pies, MD;  Location: [facility];  Service: Neurosurgery;  Laterality: N/A;  anterior   CARDIAC CATHETERIZATION     CORONARY ANGIOPLASTY     INSERT / REPLACE / REMOVE PACEMAKER     neck fusion     TOTAL KNEE ARTHROPLASTY Left 08/26/2020   Procedure: TOTAL KNEE ARTHROPLASTY;  Surgeon: Earlie Server, MD;  Location: [facility];  Service: Orthopedics;  Laterality: Left;    Current Outpatient Medications  Medication Sig Dispense Refill Last Dose   acetaminophen (TYLENOL) 325 MG tablet Take 2 tablets (650 mg total) by mouth every 4 (  four) hours as needed. 100 tablet 2    amiodarone (PACERONE) 200 MG tablet Take 100 mg by mouth daily.       atorvastatin (LIPITOR) 80 MG tablet Take 80 mg by mouth daily.       buPROPion (WELLBUTRIN SR) 150 MG 12 hr tablet Take 150 mg by mouth 2 (two) times  daily.      carvedilol (COREG) 12.5 MG tablet Take 12.5 mg by mouth in the morning and at bedtime.       Coenzyme Q10 (COQ10) 100 MG CAPS Take 100 mg by mouth daily.      docusate sodium (COLACE) 100 MG capsule Take 100 mg by mouth daily.      ezetimibe (ZETIA) 10 MG tablet Take 10 mg by mouth daily.      famotidine (PEPCID) 40 MG tablet Take 40 mg by mouth at bedtime.      furosemide (LASIX) 20 MG tablet Take 20 mg by mouth daily as needed for edema.      furosemide (LASIX) 40 MG tablet Take 1 tablet (40 mg total) by mouth daily. (Patient not taking: Reported on 08/04/2020) 30 tablet 0    lisinopril (ZESTRIL) 5 MG tablet Take 1 tablet (5 mg total) by mouth in the morning and at bedtime. Resume in 1 week (Patient taking differently: Take 5 mg by mouth in the morning and at bedtime.)      methadone (DOLOPHINE) 10 MG/5ML solution Take 20 mg by mouth daily.      methocarbamol (ROBAXIN) 500 MG tablet Take 1 tablet (500 mg total) by mouth every 6 (six) hours as needed for muscle spasms. 60 tablet 0    Multiple Vitamins-Minerals (MULTIVITAMIN WITH MINERALS) tablet Take 1 tablet by mouth daily.      nitroGLYCERIN (NITROSTAT) 0.4 MG SL tablet Place 0.4 mg under the tongue every 5 (five) minutes as needed for chest pain.       Omega-3 Fatty Acids (FISH OIL) 1000 MG CAPS Take 1,000 mg by mouth daily.      oxaprozin (DAYPRO) 600 MG tablet Take 600 mg by mouth daily as needed (pain).      oxyCODONE (OXY IR/ROXICODONE) 5 MG immediate release tablet Take one tab po q4-6hrs prn pain, may need 1-2 first couple days, max 8 tabs in 24hrs 40 tablet 0    pantoprazole (PROTONIX) 40 MG tablet Take 40 mg by mouth daily.      RIVAROXABAN (XARELTO) VTE STARTER PACK (15 & 20 MG) Follow package directions: Take one 15mg  tablet by mouth twice a day. On day 22, switch to one 20mg  tablet once a day. Take with food. 51 each 0    spironolactone (ALDACTONE) 25 MG tablet Take 25 mg by mouth daily.      No current  facility-administered medications for this visit.   Allergies  Allergen Reactions   Prednisone Other (See Comments)    Significant increase in violent temper,which led to an arrest for disorderly conduct.    Social History   Tobacco Use   Smoking status: Former    Years: 15.00    Types: Cigarettes   Smokeless tobacco: Never   Tobacco comments:    quit in 2013  Substance Use Topics   Alcohol use: Never    Family History  Problem Relation Age of Onset   CAD Father      Review of Systems  Cardiovascular:  Positive for chest pain and leg swelling.  Gastrointestinal:  Positive for constipation.  Genitourinary:  Positive for frequency.  All other systems reviewed and are negative.  Objective:  Physical Exam Constitutional:      General: He is not in acute distress.    Appearance: Normal appearance.  HENT:     Head: Normocephalic and atraumatic.  Eyes:     Extraocular Movements: Extraocular movements intact.     Pupils: Pupils are equal, round, and reactive to light.  Cardiovascular:     Rate and Rhythm: Normal rate and regular rhythm.     Pulses: Normal pulses.     Heart sounds: Normal heart sounds.  Pulmonary:     Effort: Pulmonary effort is normal. No respiratory distress.     Breath sounds: Normal breath sounds.  Abdominal:     General: Abdomen is flat. Bowel sounds are normal. There is no distension.     Palpations: Abdomen is soft.     Tenderness: There is no abdominal tenderness.  Musculoskeletal:     Cervical back: Normal range of motion and neck supple.     Comments: Examination of the right lower extremity again shows he is neurovascularly intact.  He has intact dorsiflexion and plantarflexion with the ankle.  There is no calf tenderness to palpation.  Decent range of motion with the right knee.  Joint line tenderness.  Stable to varus and valgus testing.  Lymphadenopathy:     Cervical: No cervical adenopathy.  Skin:    General: Skin is warm and dry.      Findings: No erythema or rash.  Neurological:     General: No focal deficit present.     Mental Status: He is alert and oriented to person, place, and time.  Psychiatric:        Mood and Affect: Mood normal.        Behavior: Behavior normal.    Vital signs in last 24 hours: @VSRANGES @  Labs:   Estimated body mass index is 36.08 kg/m as calculated from the following:   Height as of 10/04/20: 6' (1.829 m).   Weight as of 10/04/20: 120.7 kg.   Imaging Review Plain radiographs demonstrate moderate degenerative joint disease of the right knee(s). The overall alignment ismild varus. The bone quality appears to be good for age and reported activity level.      Assessment/Plan:  End stage arthritis, right knee   The patient history, physical examination, clinical judgment of the provider and imaging studies are consistent with end stage degenerative joint disease of the right knee(s) and total knee arthroplasty is deemed medically necessary. The treatment options including medical management, injection therapy arthroscopy and arthroplasty were discussed at length. The risks and benefits of total knee arthroplasty were presented and reviewed. The risks due to aseptic loosening, infection, stiffness, patella tracking problems, thromboembolic complications and other imponderables were discussed. The patient acknowledged the explanation, agreed to proceed with the plan and consent was signed. Patient is being admitted for inpatient treatment for surgery, pain control, PT, OT, prophylactic antibiotics, VTE prophylaxis, progressive ambulation and ADL's and discharge planning. The patient is planning to be discharged home with home health services    Anticipated LOS equal to or greater than 2 midnights due to - Age 69 and older with one or more of the following:  - Obesity  - Expected need for hospital services (PT, OT, Nursing) required for safe  discharge  - Anticipated need for postoperative  skilled nursing care or inpatient rehab  - Active co-morbidities: Chronic pain requiring opiods, Coronary Artery Disease, Heart Attack, and  DVT/VTE OR   - Unanticipated findings during/Post Surgery: None  - Patient is a high risk of re-admission due to: None

## 2021-03-17 NOTE — Progress Notes (Addendum)
Anesthesia Review:  PCP: Cardiologist : DR Vernona Rieger- LOV 07/25/20.   PT has appt on 03/27/21.  Have already requested ov note along with most recent stress test, echo and ekg and device check.   Chest x-ray : EKG :03/21/21  Echo : Stress test: Cardiac Cath :  Activity level:  cannot do a flight of stairs without difficulty  Sleep Study/ CPAP : none  Fasting Blood Sugar :      / Checks Blood Sugar -- times a day:   Blood Thinner/ Instructions /Last Dose: ASA / Instructions/ Last Dose :   Xarelto - stopped 14 days prior to surgery per pt  10/04/20- DVT-  Knee surgery on 07/2020  Pacemaker /ICD -  Covid test on 04/27/21.

## 2021-03-17 NOTE — Progress Notes (Signed)
DUE TO COVID-19 ONLY ONE VISITOR IS ALLOWED TO COME WITH YOU AND STAY IN THE WAITING ROOM ONLY DURING PRE OP AND PROCEDURE DAY OF SURGERY.  2 VISITOR  MAY VISIT WITH YOU AFTER SURGERY IN YOUR PRIVATE ROOM DURING VISITING HOURS ONLY!  YOU NEED TO HAVE A COVID 19 TEST ON___12/29/2022 @_  @_from  8am-3pm _____, THIS TEST MUST BE DONE BEFORE SURGERY,  Covid test is done at [address] [place], [address].  This is a drive thru.  No appt required. Please see map.                 Your procedure is scheduled on:               03/31/2021   Report to [facility] Main  Entrance   Report to admitting at   0730am     Call this number if you have problems the morning of surgery [phone]    REMEMBER: NO  SOLID FOOD CANDY OR GUM AFTER MIDNIGHT. CLEAR LIQUIDS UNTIL   0710am        . NOTHING BY MOUTH EXCEPT CLEAR LIQUIDS UNTIL    . PLEASE FINISH ENSURE DRINK 0710am PER SURGEON ORDER  WHICH NEEDS TO BE COMPLETED AT  0710am     .      CLEAR LIQUID DIET   Foods Allowed                                                                    Coffee and tea, regular and decaf                            Fruit ices (not with fruit pulp)                                      Iced Popsicles                                    Carbonated beverages, regular and diet                                    Cranberry, grape and apple juices Sports drinks like Gatorade Lightly seasoned clear broth or consume(fat free) Sugar, honey syrup ___________________________________________________________________      BRUSH YOUR TEETH MORNING OF SURGERY AND RINSE YOUR MOUTH OUT, NO CHEWING GUM CANDY OR MINTS.     Take these medicines the morning of surgery with A SIP OF WATER:   coreg, pacerone, wellbutrin, methadone, protonix   DO NOT TAKE ANY DIABETIC MEDICATIONS DAY OF YOUR SURGERY                               You may not have any metal on your body including hair pins and              piercings  Do  not wear jewelry, make-up, lotions, powders or  perfumes, deodorant             Do not wear nail polish on your fingernails.  Do not shave  48 hours prior to surgery.              Men may shave face and neck.   Do not bring valuables to the hospital. [facility] IS NOT             RESPONSIBLE   FOR VALUABLES.  Contacts, dentures or bridgework may not be worn into surgery.  Leave suitcase in the car. After surgery it may be brought to your room.     Patients discharged the day of surgery will not be allowed to drive home. IF YOU ARE HAVING SURGERY AND GOING HOME THE SAME DAY, YOU MUST HAVE AN ADULT TO DRIVE YOU HOME AND BE WITH YOU FOR 24 HOURS. YOU MAY GO HOME BY TAXI OR UBER OR ORTHERWISE, BUT AN ADULT MUST ACCOMPANY YOU HOME AND STAY WITH YOU FOR 24 HOURS.  Name and phone number of your driver:  Special Instructions: N/A              Please read over the following fact sheets you were given: _____________________________________________________________________  [facility], [facility], [facility] - Preparing for Surgery Before surgery, you can play an important role.  Because skin is not sterile, your skin needs to be as free of germs as possible.  You can reduce the number of germs on your skin by washing with CHG (chlorahexidine gluconate) soap before surgery.  CHG is an antiseptic cleaner which kills germs and bonds with the skin to continue killing germs even after washing. Please DO NOT use if you have an allergy to CHG or antibacterial soaps.  If your skin becomes reddened/irritated stop using the CHG and inform your nurse when you arrive at Short Stay. Do not shave (including legs and underarms) for at least 48 hours prior to the first CHG shower.  You may shave your face/neck. Please follow these instructions carefully:  1.  Shower with CHG Soap the night before surgery and the  morning of Surgery.  2.  If you choose to wash your hair, wash your hair first as usual with your  normal  shampoo.  3.  After you  shampoo, rinse your hair and body thoroughly to remove the  shampoo.                           4.  Use CHG as you would any other liquid soap.  You can apply chg directly  to the skin and wash                       Gently with a scrungie or clean washcloth.  5.  Apply the CHG Soap to your body ONLY FROM THE NECK DOWN.   Do not use on face/ open                           Wound or open sores. Avoid contact with eyes, ears mouth and genitals (private parts).                       Wash face,  Genitals (private parts) with your normal soap.             6.  Wash thoroughly, paying special attention to the area where  your surgery  will be performed.  7.  Thoroughly rinse your body with warm water from the neck down.  8.  DO NOT shower/wash with your normal soap after using and rinsing off  the CHG Soap.                9.  Pat yourself dry with a clean towel.            10.  Wear clean pajamas.            11.  Place clean sheets on your bed the night of your first shower and do not  sleep with pets. Day of Surgery : Do not apply any lotions/deodorants the morning of surgery.  Please wear clean clothes to the hospital/surgery center.  FAILURE TO FOLLOW THESE INSTRUCTIONS MAY RESULT IN THE CANCELLATION OF YOUR SURGERY PATIENT SIGNATURE_________________________________  NURSE SIGNATURE__________________________________  ________________________________________________________________________

## 2021-03-21 ENCOUNTER — Other Ambulatory Visit: Payer: Self-pay

## 2021-03-21 ENCOUNTER — Encounter (HOSPITAL_COMMUNITY): Payer: Self-pay

## 2021-03-21 ENCOUNTER — Encounter (HOSPITAL_COMMUNITY)
Admission: RE | Admit: 2021-03-21 | Discharge: 2021-03-21 | Disposition: A | Discharge status: Home / Self Care | Payer: Medicare Other | Source: Ambulatory Visit | Attending: Orthopedic Surgery | Admitting: Orthopedic Surgery

## 2021-03-21 VITALS — BP 143/90 | HR 67 | Temp 98.3°F | Resp 16 | Ht 72.0 in | Wt 259.0 lb

## 2021-03-21 DIAGNOSIS — Z01818 Encounter for other preprocedural examination: Secondary | ICD-10-CM | POA: Insufficient documentation

## 2021-03-21 DIAGNOSIS — M1711 Unilateral primary osteoarthritis, right knee: Secondary | ICD-10-CM

## 2021-03-21 DIAGNOSIS — I5041 Acute combined systolic (congestive) and diastolic (congestive) heart failure: Secondary | ICD-10-CM

## 2021-03-21 LAB — COMPREHENSIVE METABOLIC PANEL
ALT: 22 U/L (ref 0–44)
AST: 25 U/L (ref 15–41)
Albumin: 3.7 g/dL (ref 3.5–5.0)
Alkaline Phosphatase: 87 U/L (ref 38–126)
Anion gap: 8 (ref 5–15)
BUN: 16 mg/dL (ref 6–20)
CO2: 23 mmol/L (ref 22–32)
Calcium: 8.8 mg/dL — ABNORMAL LOW (ref 8.9–10.3)
Chloride: 107 mmol/L (ref 98–111)
Creatinine, Ser: 1.11 mg/dL (ref 0.61–1.24)
GFR, Estimated: >60 mL/min (ref >60)
Glucose, Bld: 124 mg/dL — ABNORMAL HIGH (ref 70–99)
Potassium: 4.7 mmol/L (ref 3.5–5.1)
Sodium: 138 mmol/L (ref 135–145)
Total Bilirubin: 0.7 mg/dL (ref 0.3–1.2)
Total Protein: 6.8 g/dL (ref 6.5–8.1)

## 2021-03-21 LAB — CBC
HCT: 41 % (ref 39.0–52.0)
Hemoglobin: 13.4 g/dL (ref 13.0–17.0)
MCH: 28.9 pg (ref 26.0–34.0)
MCHC: 32.7 g/dL (ref 30.0–36.0)
MCV: 88.4 fL (ref 80.0–100.0)
Platelets: 233 10*3/uL (ref 150–400)
RBC: 4.64 MIL/uL (ref 4.22–5.81)
RDW: 13 % (ref 11.5–15.5)
WBC: 7.1 10*3/uL (ref 4.0–10.5)
nRBC: 0 % (ref 0.0–0.2)

## 2021-03-21 LAB — SURGICAL PCR SCREEN
MRSA, PCR: POSITIVE — AB
Staphylococcus aureus: POSITIVE — AB

## 2021-03-21 NOTE — Care Plan (Signed)
Ortho Bundle Case Management Note  Patient Details  Name: Jason Thomas MRN: [id number] Date of Birth: [birth year]   Will discharge to home with family. Has equipment from prior surgery. CPM ordered from [employer]. HHPT referral to [facility] and OPPT set up with [facility]. Patient and MD in agreement with plan. Choice offered    DME Arranged:    DME Agency:     HH Arranged:  PT HH Agency:  [facility]  Additional Comments: Please contact me with any questions of if this plan should need to change.  Shauna Hugh,  RN,BSN,MHA,CCM  [facility]  [phone] 03/21/2021, 9:23 AM

## 2021-03-28 ENCOUNTER — Other Ambulatory Visit: Payer: Self-pay | Admitting: Orthopedic Surgery

## 2021-03-28 LAB — SARS CORONAVIRUS 2 (TAT 6-24 HRS): SARS Coronavirus 2: NEGATIVE

## 2021-03-30 NOTE — Progress Notes (Signed)
Anesthesia Chart Review   Case: [id number] Date/Time: 03/31/21 0958   Procedure: TOTAL KNEE ARTHROPLASTY (Right: Knee)   Anesthesia type: Spinal   Pre-op diagnosis: OA RIGHT KNEE   Location: [facility] ROOM 07 / [facility]   Surgeons: Frederico Hamman, MD       DISCUSSION:60 y.o. former smoker with h/o HTN, GERD, CAD (stent LAD 2013), CHF, AICD in place, right knee OA scheduled for above procedure 03/31/21 with Dr. Frederico Hamman.   Pt last seen by cardiology 03/27/2021. Per OV note pt doing well, asymptomatic, CHF well compensated. No changes made at this visit.  Cardiologist aware of upcoming procedure, no contraindications.   Device orders requested.   Anticipate pt can proceed with planned procedure barring acute status change.   VS: BP (!) 143/90   Pulse 67   Temp 36.8 C (Oral)   Resp 16   Ht 6' (1.829 m)   Wt 117.5 kg   SpO2 98%   BMI 35.13 kg/m   PROVIDERS: Street, Stephanie Coup, MD is PCP   Terald Sleeper, MD is Cardiologist  LABS: Labs reviewed: Acceptable for surgery. (all labs ordered are listed, but only abnormal results are displayed)  Labs Reviewed  SURGICAL PCR SCREEN - Abnormal; Notable for the following components:      Result Value   MRSA, PCR POSITIVE (*)    Staphylococcus aureus POSITIVE (*)    All other components within normal limits  COMPREHENSIVE METABOLIC PANEL - Abnormal; Notable for the following components:   Glucose, Bld 124 (*)    Calcium 8.8 (*)    All other components within normal limits  CBC  TYPE AND SCREEN     IMAGES:   EKG: 03/21/21 Rate 67 bpm  Normal sinus rhythm Right bundle branch block Septal infarct , age undetermined Lateral infarct , age undetermined Abnormal ECG No significant change since last tracing  CV: Stress Test 07/12/2020 Abnormal pharmacologic stress nuclear study but negative for ischemia There is evidence of an infarct noted in the lateral segment There is a scar in the antero-apical segment, apical  segment Clinically negative stress test with no chest pain LV systolic function is abnormal, Please refer to findings The calculated ejection fraction is 20% The exercise tolerance was not assessed due to pharmacologic stress  Echo 02/06/2019 Summary  There is borderline left ventricular hypertrophy.  Severely reduced LV systolic function with a large area of distal anterior,  distal inferior and apical dyskinesis.  LVEF estimated at 30-35%  Mild dilation of the aortic root (4.1 cm) and the ascending aorta.  No significant valve disease  No change compared to echo from 10/2016 Past Medical History:  Diagnosis Date   AICD (automatic cardioverter/defibrillator) present    Arthritis 2019   CAD (coronary artery disease)    CHF (congestive heart failure) (HCC)    Chronic pain    Chronic systolic heart failure (HCC)    GERD (gastroesophageal reflux disease)    History of kidney stones    Hyperlipidemia    Hypertension    Ischemic cardiomyopathy    Myocardial infarction [facility])    2013   Presence of permanent cardiac pacemaker     Past Surgical History:  Procedure Laterality Date   ANTERIOR CERVICAL DECOMP/DISCECTOMY FUSION N/A 08/10/2019   Procedure: ANTERIOR CERVICAL DECOMPRESSION/DISCECTOMY FUSION, INTERBODY PROSTHESIS, PLATE/SCREWS CERVICAL THREE- CERVICAL FOUR, CERVICAL FIVE CERVICAL SIX;  Surgeon: Tressie Stalker, MD;  [facility] OR;  Service: Neurosurgery;  Laterality: N/A;  anterior   CARDIAC CATHETERIZATION  CORONARY ANGIOPLASTY     INSERT / REPLACE / REMOVE PACEMAKER     neck fusion     TOTAL KNEE ARTHROPLASTY Left 08/26/2020   Procedure: TOTAL KNEE ARTHROPLASTY;  Surgeon: Frederico Hamman, MD;  Location: [facility] ORS;  Service: Orthopedics;  Laterality: Left;    MEDICATIONS:  acetaminophen (TYLENOL) 325 MG tablet   amiodarone (PACERONE) 200 MG tablet   atorvastatin (LIPITOR) 80 MG tablet   buPROPion (WELLBUTRIN SR) 150 MG 12 hr tablet   carvedilol (COREG) 12.5 MG tablet    Coenzyme Q10 (COQ10) 200 MG CAPS   docusate sodium (COLACE) 100 MG capsule   ezetimibe (ZETIA) 10 MG tablet   famotidine (PEPCID) 40 MG tablet   furosemide (LASIX) 40 MG tablet   lisinopril (ZESTRIL) 5 MG tablet   methadone (DOLOPHINE) 10 MG/5ML solution   methocarbamol (ROBAXIN) 500 MG tablet   Multiple Vitamins-Minerals (MULTIVITAMIN WITH MINERALS) tablet   nitroGLYCERIN (NITROSTAT) 0.4 MG SL tablet   Omega-3 Fatty Acids (FISH OIL) 1000 MG CAPS   oxyCODONE (OXY IR/ROXICODONE) 5 MG immediate release tablet   pantoprazole (PROTONIX) 40 MG tablet   rivaroxaban (XARELTO) 20 MG TABS tablet   RIVAROXABAN (XARELTO) VTE STARTER PACK (15 & 20 MG)   spironolactone (ALDACTONE) 25 MG tablet   No current facility-administered medications for this encounter.     Jodell Cipro Ward, PA-C [facility] Pre-Surgical Testing [phone]

## 2021-03-30 NOTE — Anesthesia Preprocedure Evaluation (Addendum)
Anesthesia Evaluation  Patient identified by MRN, date of birth, ID band Patient awake    Reviewed: Allergy & Precautions, NPO status , Patient's Chart, lab work & pertinent test results  Airway Mallampati: II  TM Distance: >3 FB Neck ROM: Full    Dental no notable dental hx. (+) Dental Advisory Given, Edentulous Upper, Edentulous Lower   Pulmonary former smoker,    Pulmonary exam normal breath sounds clear to auscultation       Cardiovascular hypertension, + CAD, + Past MI and +CHF  Cardiac stents: 2013.  Normal cardiovascular exam+ pacemaker + Cardiac Defibrillator  Rhythm:Regular Rate:Normal  Stress Test 07/12/2020 1. Abnormal pharmacologic stress nuclear study but negative for ischemia 2. There is evidence of an infarct noted in the lateral segment 3. There is a scar in the antero-apical segment, apical segment 4. Clinically negative stress test with no chest pain 5. LV systolic function is abnormal, Please refer to findings 6. The calculated ejection fraction is 20% 7. The exercise tolerance was not assessed due to pharmacologic stress   Neuro/Psych    GI/Hepatic GERD  ,  Endo/Other    Renal/GU Renal InsufficiencyRenal diseaseLab Results      Component                Value               Date                      CREATININE               1.11                03/21/2021                BUN                      16                  03/21/2021                NA                       138                 03/21/2021                K                        4.7                 03/21/2021                CL                       107                 03/21/2021                CO2                      23                  03/21/2021                Musculoskeletal  (+) Arthritis ,   Abdominal (+) + obese (BMI 35.12),  Peds  Hematology Lab Results      Component                Value               Date                      WBC                       7.1                 03/21/2021                HGB                      13.4                03/21/2021                HCT                      41.0                03/21/2021                MCV                      88.4                03/21/2021                PLT                      233                 03/21/2021              Anesthesia Other Findings All to prednisone  Reproductive/Obstetrics                           Anesthesia Physical Anesthesia Plan  ASA: 4  Anesthesia Plan: Spinal and Regional   Post-op Pain Management: Regional block and Minimal or no pain anticipated   Induction:   PONV Risk Score and Plan: Treatment may vary due to age or medical condition, Ondansetron and Midazolam  Airway Management Planned: Natural Airway and Simple Face Mask  Additional Equipment: None  Intra-op Plan:   Post-operative Plan:   Informed Consent: I have reviewed the patients History and Physical, chart, labs and discussed the procedure including the risks, benefits and alternatives for the proposed anesthesia with the patient or authorized representative who has indicated his/her understanding and acceptance.     Dental advisory given  Plan Discussed with: CRNA  Anesthesia Plan Comments: (See PAT note 03/21/21, Jodell Cipro Ward, PA-C  Pt on riivaroxiban check last dose 11/18  Sp w R  Adductor canal block  )      Anesthesia Quick Evaluation

## 2021-03-31 ENCOUNTER — Ambulatory Visit (HOSPITAL_COMMUNITY): Payer: Medicare Other | Admitting: Physician Assistant

## 2021-03-31 ENCOUNTER — Encounter (HOSPITAL_COMMUNITY): Payer: Self-pay | Admitting: Orthopedic Surgery

## 2021-03-31 ENCOUNTER — Ambulatory Visit (HOSPITAL_COMMUNITY)
Admission: RE | Admit: 2021-03-31 | Discharge: 2021-03-31 | Disposition: A | Discharge status: Home / Self Care | Payer: Medicare Other | Source: Ambulatory Visit | Attending: Orthopedic Surgery | Admitting: Orthopedic Surgery

## 2021-03-31 ENCOUNTER — Encounter (HOSPITAL_COMMUNITY)
Admission: RE | Disposition: A | Discharge status: Home / Self Care | Payer: Self-pay | Source: Ambulatory Visit | Attending: Orthopedic Surgery

## 2021-03-31 ENCOUNTER — Ambulatory Visit (HOSPITAL_COMMUNITY): Payer: Medicare Other | Admitting: Anesthesiology

## 2021-03-31 DIAGNOSIS — Z95810 Presence of automatic (implantable) cardiac defibrillator: Secondary | ICD-10-CM | POA: Diagnosis not present

## 2021-03-31 DIAGNOSIS — Z86718 Personal history of other venous thrombosis and embolism: Secondary | ICD-10-CM | POA: Diagnosis not present

## 2021-03-31 DIAGNOSIS — Z955 Presence of coronary angioplasty implant and graft: Secondary | ICD-10-CM | POA: Insufficient documentation

## 2021-03-31 DIAGNOSIS — I252 Old myocardial infarction: Secondary | ICD-10-CM | POA: Diagnosis not present

## 2021-03-31 DIAGNOSIS — M1711 Unilateral primary osteoarthritis, right knee: Secondary | ICD-10-CM | POA: Insufficient documentation

## 2021-03-31 DIAGNOSIS — K219 Gastro-esophageal reflux disease without esophagitis: Secondary | ICD-10-CM | POA: Diagnosis not present

## 2021-03-31 DIAGNOSIS — Z6835 Body mass index (BMI) 35.0-35.9, adult: Secondary | ICD-10-CM | POA: Insufficient documentation

## 2021-03-31 DIAGNOSIS — Z79891 Long term (current) use of opiate analgesic: Secondary | ICD-10-CM | POA: Insufficient documentation

## 2021-03-31 DIAGNOSIS — Z87891 Personal history of nicotine dependence: Secondary | ICD-10-CM | POA: Insufficient documentation

## 2021-03-31 DIAGNOSIS — I509 Heart failure, unspecified: Secondary | ICD-10-CM | POA: Insufficient documentation

## 2021-03-31 DIAGNOSIS — G8929 Other chronic pain: Secondary | ICD-10-CM | POA: Diagnosis not present

## 2021-03-31 DIAGNOSIS — I2510 Atherosclerotic heart disease of native coronary artery without angina pectoris: Secondary | ICD-10-CM | POA: Insufficient documentation

## 2021-03-31 DIAGNOSIS — E669 Obesity, unspecified: Secondary | ICD-10-CM | POA: Insufficient documentation

## 2021-03-31 DIAGNOSIS — I110 Hypertensive heart disease with heart failure: Secondary | ICD-10-CM | POA: Diagnosis not present

## 2021-03-31 HISTORY — PX: TOTAL KNEE ARTHROPLASTY: SHX125

## 2021-03-31 LAB — TYPE AND SCREEN
ABO/RH(D): A POS
Antibody Screen: NEGATIVE

## 2021-03-31 SURGERY — ARTHROPLASTY, KNEE, TOTAL
Anesthesia: Regional | Site: Knee | Laterality: Right

## 2021-03-31 MED ORDER — OXYCODONE HCL 5 MG PO TABS
ORAL_TABLET | ORAL | Status: AC
  Filled 2021-03-31: qty 1

## 2021-03-31 MED ORDER — PHENYLEPHRINE HCL (PRESSORS) 10 MG/ML IV SOLN
INTRAVENOUS | Status: AC
  Filled 2021-03-31: qty 1

## 2021-03-31 MED ORDER — SODIUM CHLORIDE 0.9 % IR SOLN
Status: DC | PRN
  Administered 2021-03-31: 1000 mL

## 2021-03-31 MED ORDER — TRANEXAMIC ACID 1000 MG/10ML IV SOLN
INTRAVENOUS | Status: DC | PRN
  Administered 2021-03-31: 2000 mg via TOPICAL

## 2021-03-31 MED ORDER — OXYCODONE HCL 5 MG/5ML PO SOLN: 5.0000 mg | Freq: Once | ORAL | Status: AC | PRN

## 2021-03-31 MED ORDER — BUPIVACAINE LIPOSOME 1.3 % IJ SUSP
INTRAMUSCULAR | Status: AC
  Filled 2021-03-31: qty 20

## 2021-03-31 MED ORDER — LACTATED RINGERS IV BOLUS
500.0000 mL | Freq: Once | INTRAVENOUS | Status: AC
  Administered 2021-03-31: 500 mL via INTRAVENOUS

## 2021-03-31 MED ORDER — PROPOFOL 10 MG/ML IV BOLUS
INTRAVENOUS | Status: AC
  Filled 2021-03-31: qty 20

## 2021-03-31 MED ORDER — ROPIVACAINE HCL 5 MG/ML IJ SOLN: INTRAMUSCULAR | Status: DC | PRN

## 2021-03-31 MED ORDER — AMISULPRIDE (ANTIEMETIC) 5 MG/2ML IV SOLN: 10.0000 mg | Freq: Once | INTRAVENOUS | Status: DC | PRN

## 2021-03-31 MED ORDER — LACTATED RINGERS IV BOLUS
250.0000 mL | Freq: Once | INTRAVENOUS | Status: AC
  Administered 2021-03-31: 250 mL via INTRAVENOUS

## 2021-03-31 MED ORDER — MIDAZOLAM HCL 2 MG/2ML IJ SOLN
INTRAMUSCULAR | Status: AC
  Filled 2021-03-31: qty 2

## 2021-03-31 MED ORDER — ONDANSETRON HCL 4 MG/2ML IJ SOLN
INTRAMUSCULAR | Status: AC
  Filled 2021-03-31: qty 2

## 2021-03-31 MED ORDER — TRANEXAMIC ACID 1000 MG/10ML IV SOLN
2000.0000 mg | INTRAVENOUS | Status: DC
  Filled 2021-03-31: qty 20

## 2021-03-31 MED ORDER — CEFAZOLIN SODIUM-DEXTROSE 2-4 GM/100ML-% IV SOLN
2.0000 g | INTRAVENOUS | Status: AC
  Administered 2021-03-31: 2 g via INTRAVENOUS
  Filled 2021-03-31: qty 100

## 2021-03-31 MED ORDER — MIDAZOLAM HCL 2 MG/2ML IJ SOLN
1.0000 mg | Freq: Once | INTRAMUSCULAR | Status: AC
  Administered 2021-03-31: 1 mg via INTRAVENOUS
  Filled 2021-03-31: qty 2

## 2021-03-31 MED ORDER — PHENYLEPHRINE 40 MCG/ML (10ML) SYRINGE FOR IV PUSH (FOR BLOOD PRESSURE SUPPORT)
PREFILLED_SYRINGE | INTRAVENOUS | Status: AC
  Filled 2021-03-31: qty 10

## 2021-03-31 MED ORDER — PROPOFOL 500 MG/50ML IV EMUL
INTRAVENOUS | Status: DC | PRN
  Administered 2021-03-31: 50 ug/kg/min via INTRAVENOUS

## 2021-03-31 MED ORDER — SODIUM CHLORIDE 0.9 % IV SOLN: INTRAVENOUS | Status: DC

## 2021-03-31 MED ORDER — 0.9 % SODIUM CHLORIDE (POUR BTL) OPTIME
TOPICAL | Status: DC | PRN
  Administered 2021-03-31: 1000 mL

## 2021-03-31 MED ORDER — POVIDONE-IODINE 10 % EX SWAB
2.0000 "application " | Freq: Once | CUTANEOUS | Status: AC
  Administered 2021-03-31: 2 via TOPICAL

## 2021-03-31 MED ORDER — BUPIVACAINE LIPOSOME 1.3 % IJ SUSP
INTRAMUSCULAR | Status: DC | PRN
  Administered 2021-03-31: 20 mL

## 2021-03-31 MED ORDER — VANCOMYCIN HCL 1500 MG/300ML IV SOLN
1500.0000 mg | INTRAVENOUS | Status: AC
  Administered 2021-03-31: 1500 mg via INTRAVENOUS
  Filled 2021-03-31: qty 300

## 2021-03-31 MED ORDER — ONDANSETRON HCL 4 MG/2ML IJ SOLN
INTRAMUSCULAR | Status: DC | PRN
  Administered 2021-03-31: 4 mg via INTRAVENOUS

## 2021-03-31 MED ORDER — PHENYLEPHRINE HCL-NACL 20-0.9 MG/250ML-% IV SOLN
INTRAVENOUS | Status: DC | PRN
  Administered 2021-03-31: 20 ug/min via INTRAVENOUS

## 2021-03-31 MED ORDER — ACETAMINOPHEN 500 MG PO TABS
1000.0000 mg | ORAL_TABLET | Freq: Once | ORAL | Status: AC
  Administered 2021-03-31: 1000 mg via ORAL
  Filled 2021-03-31: qty 2

## 2021-03-31 MED ORDER — MIDAZOLAM HCL 2 MG/2ML IJ SOLN
INTRAMUSCULAR | Status: DC | PRN
  Administered 2021-03-31: 2 mg via INTRAVENOUS

## 2021-03-31 MED ORDER — OXYCODONE HCL 5 MG PO TABS
5.0000 mg | ORAL_TABLET | Freq: Once | ORAL | Status: AC | PRN
  Administered 2021-03-31: 5 mg via ORAL

## 2021-03-31 MED ORDER — PROPOFOL 500 MG/50ML IV EMUL
INTRAVENOUS | Status: AC
  Filled 2021-03-31: qty 50

## 2021-03-31 MED ORDER — ROPIVACAINE HCL 5 MG/ML IJ SOLN
INTRAMUSCULAR | Status: DC | PRN
  Administered 2021-03-31: 30 mL via EPIDURAL

## 2021-03-31 MED ORDER — HYDROMORPHONE HCL 1 MG/ML IJ SOLN: 0.2500 mg | INTRAMUSCULAR | Status: DC | PRN

## 2021-03-31 MED ORDER — SODIUM CHLORIDE (PF) 0.9 % IJ SOLN
INTRAMUSCULAR | Status: AC
  Filled 2021-03-31: qty 10

## 2021-03-31 MED ORDER — DEXAMETHASONE SODIUM PHOSPHATE 10 MG/ML IJ SOLN
INTRAMUSCULAR | Status: AC
  Filled 2021-03-31: qty 1

## 2021-03-31 MED ORDER — CLONIDINE HCL (ANALGESIA) 100 MCG/ML EP SOLN
EPIDURAL | Status: DC | PRN
  Administered 2021-03-31: 100 ug

## 2021-03-31 MED ORDER — TRANEXAMIC ACID-NACL 1000-0.7 MG/100ML-% IV SOLN
1000.0000 mg | INTRAVENOUS | Status: AC
  Administered 2021-03-31: 1000 mg via INTRAVENOUS
  Filled 2021-03-31: qty 100

## 2021-03-31 MED ORDER — BUPIVACAINE IN DEXTROSE 0.75-8.25 % IT SOLN
INTRATHECAL | Status: DC | PRN
  Administered 2021-03-31: 1.7 mL via INTRATHECAL

## 2021-03-31 MED ORDER — ONDANSETRON HCL 4 MG/2ML IJ SOLN: 4.0000 mg | Freq: Once | INTRAMUSCULAR | Status: DC | PRN

## 2021-03-31 MED ORDER — OXYCODONE HCL 5 MG PO TABS: ORAL_TABLET | ORAL | 0 refills | Status: DC

## 2021-03-31 MED ORDER — BUPIVACAINE LIPOSOME 1.3 % IJ SUSP: 20.0000 mL | Freq: Once | INTRAMUSCULAR | Status: DC

## 2021-03-31 MED ORDER — SODIUM CHLORIDE 0.9 % IV SOLN
INTRAVENOUS | Status: DC | PRN
  Administered 2021-03-31: 60 mL via INTRAMUSCULAR

## 2021-03-31 MED ORDER — PROPOFOL 10 MG/ML IV BOLUS
INTRAVENOUS | Status: DC | PRN
  Administered 2021-03-31 (×2): 20 mg via INTRAVENOUS

## 2021-03-31 MED ORDER — METHOCARBAMOL 500 MG PO TABS
500.0000 mg | ORAL_TABLET | Freq: Four times a day (QID) | ORAL | 0 refills | Status: DC | PRN
Start: 2021-03-31 — End: 2023-10-19

## 2021-03-31 MED ORDER — TRANEXAMIC ACID-NACL 1000-0.7 MG/100ML-% IV SOLN: 1000.0000 mg | Freq: Once | INTRAVENOUS | Status: DC

## 2021-03-31 MED ORDER — WATER FOR IRRIGATION, STERILE IR SOLN
Status: DC | PRN
  Administered 2021-03-31: 2000 mL

## 2021-03-31 MED ORDER — FENTANYL CITRATE PF 50 MCG/ML IJ SOSY
50.0000 ug | PREFILLED_SYRINGE | Freq: Once | INTRAMUSCULAR | Status: AC
  Administered 2021-03-31: 50 ug via INTRAVENOUS
  Filled 2021-03-31: qty 2

## 2021-03-31 MED ORDER — ACETAMINOPHEN 10 MG/ML IV SOLN: 1000.0000 mg | Freq: Once | INTRAVENOUS | Status: DC | PRN

## 2021-03-31 MED ORDER — LACTATED RINGERS IV SOLN: INTRAVENOUS | Status: DC

## 2021-03-31 MED ORDER — PHENYLEPHRINE 40 MCG/ML (10ML) SYRINGE FOR IV PUSH (FOR BLOOD PRESSURE SUPPORT)
PREFILLED_SYRINGE | INTRAVENOUS | Status: DC | PRN
  Administered 2021-03-31: 80 ug via INTRAVENOUS

## 2021-03-31 MED ORDER — BUPIVACAINE-EPINEPHRINE (PF) 0.25% -1:200000 IJ SOLN
INTRAMUSCULAR | Status: DC | PRN
  Administered 2021-03-31: 30 mL

## 2021-03-31 MED ORDER — CHLORHEXIDINE GLUCONATE 0.12 % MT SOLN
15.0000 mL | Freq: Once | OROMUCOSAL | Status: AC
  Administered 2021-03-31: 15 mL via OROMUCOSAL

## 2021-03-31 MED ORDER — BUPIVACAINE-EPINEPHRINE (PF) 0.25% -1:200000 IJ SOLN
INTRAMUSCULAR | Status: AC
  Filled 2021-03-31: qty 30

## 2021-03-31 MED ORDER — ORAL CARE MOUTH RINSE: 15.0000 mL | Freq: Once | OROMUCOSAL | Status: AC

## 2021-03-31 SURGICAL SUPPLY — 59 items
ATTUNE MED DOME PAT 41 KNEE (Knees) ×2 IMPLANT
ATTUNE PS FEM RT SZ 7 CEM KNEE (Femur) ×2 IMPLANT
ATTUNE PSRP INSR SZ7 7 KNEE (Insert) ×2 IMPLANT
BAG COUNTER SPONGE SURGICOUNT (BAG) ×2 IMPLANT
BAG DECANTER FOR FLEXI CONT (MISCELLANEOUS) ×2 IMPLANT
BAG SPEC THK2 15X12 ZIP CLS (MISCELLANEOUS) ×1
BAG SPNG CNTER NS LX DISP (BAG) ×1
BAG ZIPLOCK 12X15 (MISCELLANEOUS) ×2 IMPLANT
BASE TIBIAL ROT PLAT SZ 7 KNEE (Knees) ×1 IMPLANT
BLADE SAGITTAL 25.0X1.19X90 (BLADE) ×2 IMPLANT
BLADE SAW SGTL 13X75X1.27 (BLADE) ×2 IMPLANT
BLADE SURG 15 STRL LF DISP TIS (BLADE) ×1 IMPLANT
BLADE SURG 15 STRL SS (BLADE) ×2
BLADE SURG SZ10 CARB STEEL (BLADE) ×4 IMPLANT
BNDG CMPR MED 15X6 ELC VLCR LF (GAUZE/BANDAGES/DRESSINGS) ×1
BNDG ELASTIC 6X15 VLCR STRL LF (GAUZE/BANDAGES/DRESSINGS) ×2 IMPLANT
BONE CEMENT GENTAMICIN (Cement) ×4 IMPLANT
BOWL SMART MIX CTS (DISPOSABLE) ×2 IMPLANT
BSPLAT TIB 7 CMNT ROT PLAT STR (Knees) ×1 IMPLANT
CEMENT BONE GENTAMICIN 40 (Cement) ×2 IMPLANT
CLSR STERI-STRIP ANTIMIC 1/2X4 (GAUZE/BANDAGES/DRESSINGS) ×4 IMPLANT
COVER SURGICAL LIGHT HANDLE (MISCELLANEOUS) ×2 IMPLANT
CUFF TOURN SGL QUICK 34 (TOURNIQUET CUFF) ×2
CUFF TRNQT CYL 34X4.125X (TOURNIQUET CUFF) ×1 IMPLANT
DECANTER SPIKE VIAL GLASS SM (MISCELLANEOUS) ×4 IMPLANT
DRAPE INCISE IOBAN 66X45 STRL (DRAPES) ×2 IMPLANT
DRAPE U-SHAPE 47X51 STRL (DRAPES) ×2 IMPLANT
DRESSING AQUACEL AG SP 3.5X10 (GAUZE/BANDAGES/DRESSINGS) ×1 IMPLANT
DRSG AQUACEL AG SP 3.5X10 (GAUZE/BANDAGES/DRESSINGS) ×2
DURAPREP 26ML APPLICATOR (WOUND CARE) ×4 IMPLANT
ELECT REM PT RETURN 15FT ADLT (MISCELLANEOUS) ×2 IMPLANT
GLOVE SRG 8 PF TXTR STRL LF DI (GLOVE) ×2 IMPLANT
GLOVE SURG ORTHO LTX SZ8 (GLOVE) ×2 IMPLANT
GLOVE SURG POLYISO LF SZ7.5 (GLOVE) ×2 IMPLANT
GLOVE SURG UNDER POLY LF SZ8 (GLOVE) ×4
GOWN STRL REUS W/TWL XL LVL3 (GOWN DISPOSABLE) ×8 IMPLANT
HANDPIECE INTERPULSE COAX TIP (DISPOSABLE) ×2
HOLDER FOLEY CATH W/STRAP (MISCELLANEOUS) IMPLANT
HOOD PEEL AWAY FLYTE STAYCOOL (MISCELLANEOUS) ×2 IMPLANT
IMMOBILIZER KNEE 20 (SOFTGOODS) ×2
IMMOBILIZER KNEE 20 THIGH 36 (SOFTGOODS) ×1 IMPLANT
KIT TURNOVER KIT A (KITS) IMPLANT
MANIFOLD NEPTUNE II (INSTRUMENTS) ×2 IMPLANT
NEEDLE HYPO 22GX1.5 SAFETY (NEEDLE) ×4 IMPLANT
NS IRRIG 1000ML POUR BTL (IV SOLUTION) ×2 IMPLANT
PACK TOTAL KNEE CUSTOM (KITS) ×2 IMPLANT
PROTECTOR NERVE ULNAR (MISCELLANEOUS) ×2 IMPLANT
SET HNDPC FAN SPRY TIP SCT (DISPOSABLE) ×1 IMPLANT
SUT ETHIBOND NAB CT1 #1 30IN (SUTURE) ×4 IMPLANT
SUT MNCRL AB 3-0 PS2 18 (SUTURE) ×2 IMPLANT
SUT VIC AB 0 CT1 36 (SUTURE) ×2 IMPLANT
SUT VIC AB 2-0 CT1 27 (SUTURE) ×4
SUT VIC AB 2-0 CT1 TAPERPNT 27 (SUTURE) ×2 IMPLANT
SYR CONTROL 10ML LL (SYRINGE) ×6 IMPLANT
TIBIAL BASE ROT PLAT SZ 7 KNEE (Knees) ×2 IMPLANT
TOWEL OR 17X26 10 PK STRL BLUE (TOWEL DISPOSABLE) ×2 IMPLANT
TRAY FOLEY MTR SLVR 16FR STAT (SET/KITS/TRAYS/PACK) ×2 IMPLANT
WATER STERILE IRR 1000ML POUR (IV SOLUTION) ×4 IMPLANT
WRAP KNEE MAXI GEL POST OP (GAUZE/BANDAGES/DRESSINGS) ×2 IMPLANT

## 2021-03-31 NOTE — Anesthesia Procedure Notes (Signed)
Spinal  Patient location during procedure: OR Start time: 03/31/2021 10:34 AM End time: 03/31/2021 10:38 AM Reason for block: surgical anesthesia Staffing Performed: resident/CRNA  Resident/CRNA: Sharlette Dense, CRNA Preanesthetic Checklist Completed: patient identified, IV checked, site marked, risks and benefits discussed, surgical consent, monitors and equipment checked, pre-op evaluation and timeout performed Spinal Block Patient position: sitting Prep: DuraPrep and site prepped and draped Patient monitoring: heart rate, continuous pulse ox and blood pressure Approach: midline Location: L3-4 Injection technique: single-shot Needle Needle type: Pencan  Needle gauge: 24 G Needle length: 9 cm Assessment Sensory level: T6 Additional Notes Kit expiration date 06/28/2022 and lot # [id number] Clear free flow CSF, negative heme, negative paresthesia Tolerated well and returned to supine position

## 2021-03-31 NOTE — Evaluation (Signed)
Physical Therapy Evaluation Patient Details Name: Jason Thomas MRN: [id number] DOB: 12-Nov-1960 Today's Date: 03/31/2021  History of Present Illness  60 y.o. male admitted 03/31/21 for R TKA. PMH includes L TKA 08/26/20, cervical fusion 2021, ARF, LBP, GERD, MI, defibrillator.  Clinical Impression  Pt is s/p TKA resulting in the deficits listed below (see PT Problem List). Pt ambulated 180' with RW, completed stair training and demonstrates good understanding of HEP.  He is ready to DC home from a PT standpoint.     Recommendations for follow up therapy are one component of a multi-disciplinary discharge planning process, led by the attending physician.  Recommendations may be updated based on patient status, additional functional criteria and insurance authorization.  Follow Up Recommendations Follow physician's recommendations for discharge plan and follow up therapies    Assistance Recommended at Discharge Intermittent Supervision/Assistance  Functional Status Assessment Patient has had a recent decline in their functional status and demonstrates the ability to make significant improvements in function in a reasonable and predictable amount of time.  Equipment Recommendations  None recommended by PT    Recommendations for Other Services       Precautions / Restrictions Precautions Precautions: Knee Precaution Booklet Issued: Yes (comment) Precaution Comments: reviewed no pillow under knee Restrictions Weight Bearing Restrictions: No      Mobility  Bed Mobility Overal bed mobility: Modified Independent             General bed mobility comments: HOB up, used rail    Transfers Overall transfer level: Needs assistance Equipment used: Rolling walker (2 wheels) Transfers: Sit to/from Stand Sit to Stand: Supervision           General transfer comment: good hand placement    Ambulation/Gait Ambulation/Gait assistance: Supervision Gait Distance (Feet): 180  Feet Assistive device: Rolling walker (2 wheels) Gait Pattern/deviations: Step-to pattern;Decreased step length - right;Decreased step length - left Gait velocity: WFL     General Gait Details: good sequencing, no buckling nor loss of balance  Stairs Stairs: Yes Stairs assistance: Min assist Stair Management: No rails;With walker;Step to pattern Number of Stairs: 3 General stair comments: VCs sequencing, no loss of balance, min A to steady RW  Wheelchair Mobility    Modified Rankin (Stroke Patients Only)       Balance Overall balance assessment: Modified Independent                                           Pertinent Vitals/Pain Pain Assessment: 0-10 Pain Score: 8  Pain Location: R knee Pain Descriptors / Indicators: Sore Pain Intervention(s): Limited activity within patient's tolerance;Monitored during session;Patient requesting pain meds-RN notified;RN gave pain meds during session;Ice applied;Repositioned    Home Living Family/patient expects to be discharged to:: Private residence Living Arrangements: Spouse/significant other Available Help at Discharge: Family;Available 24 hours/day Type of Home: House Home Access: Stairs to enter Entrance Stairs-Rails: None Entrance Stairs-Number of Steps: 3   Home Layout: One level Home Equipment: Conservation officer, nature (2 wheels)      Prior Function Prior Level of Function : Independent/Modified Independent             Mobility Comments: denies falls in past 6 months, walked without AD       Hand Dominance        Extremity/Trunk Assessment   Upper Extremity Assessment Upper Extremity Assessment: Overall WFL for tasks assessed  Lower Extremity Assessment Lower Extremity Assessment: RLE deficits/detail RLE Deficits / Details: SLR at least 3/5, knee AAROM ~5-65* RLE Sensation: WNL RLE Coordination: WNL    Cervical / Trunk Assessment Cervical / Trunk Assessment: Normal  Communication    Communication: No difficulties  Cognition Arousal/Alertness: Awake/alert Behavior During Therapy: WFL for tasks assessed/performed Overall Cognitive Status: Within Functional Limits for tasks assessed                                          General Comments      Exercises Total Joint Exercises Ankle Circles/Pumps: AROM;Both;15 reps;Supine Quad Sets: AROM;Both;5 reps;Supine Short Arc Quad: AROM;Right;5 reps;Supine Heel Slides: AAROM;Right;5 reps;Supine Hip ABduction/ADduction: AROM;Right;5 reps;Supine Straight Leg Raises: AROM;Right;5 reps;Supine Long Arc Quad: AROM;Right;5 reps;Seated Knee Flexion: AAROM;Right;Seated;5 reps Goniometric ROM: 5-65* AAROM R knee   Assessment/Plan    PT Assessment All further PT needs can be met in the next venue of care  PT Problem List Decreased range of motion;Decreased strength;Pain       PT Treatment Interventions      PT Goals (Current goals can be found in the Care Plan section)  Acute Rehab PT Goals Patient Stated Goal: fishing in his boat PT Goal Formulation: All assessment and education complete, DC therapy    Frequency     Barriers to discharge        Co-evaluation               AM-PAC PT "6 Clicks" Mobility  Outcome Measure Help needed turning from your back to your side while in a flat bed without using bedrails?: None Help needed moving from lying on your back to sitting on the side of a flat bed without using bedrails?: None Help needed moving to and from a bed to a chair (including a wheelchair)?: None Help needed standing up from a chair using your arms (e.g., wheelchair or bedside chair)?: None Help needed to walk in hospital room?: None Help needed climbing 3-5 steps with a railing? : A Little 6 Click Score: 23    End of Session Equipment Utilized During Treatment: Gait belt Activity Tolerance: Patient tolerated treatment well Patient left: in bed;with call bell/phone within reach Nurse  Communication: Mobility status PT Visit Diagnosis: Pain Pain - Right/Left: Right Pain - part of body: Knee    Time: 1543-1611 PT Time Calculation (min) (ACUTE ONLY): 28 min   Charges:   PT Evaluation $PT Eval Moderate Complexity: 1 Mod PT Treatments $Gait Training: 8-22 mins       Blondell Reveal Kistler PT 03/31/2021  Acute Rehabilitation Services Pager [phone] Office [phone]

## 2021-03-31 NOTE — Interval H&P Note (Signed)
History and Physical Interval Note:  03/31/2021 9:37 AM  Jason Thomas  has presented today for surgery, with the diagnosis of OA RIGHT KNEE.  The various methods of treatment have been discussed with the patient and family. After consideration of risks, benefits and other options for treatment, the patient has consented to  Procedure(s): TOTAL KNEE ARTHROPLASTY (Right) as a surgical intervention.  The patient's history has been reviewed, patient examined, no change in status, stable for surgery.  I have reviewed the patient's chart and labs.  Questions were answered to the patient's satisfaction.     Thera Flake

## 2021-03-31 NOTE — Transfer of Care (Signed)
Immediate Anesthesia Transfer of Care Note  Patient: Jason Thomas  Procedure(s) Performed: TOTAL KNEE ARTHROPLASTY (Right: Knee)  Patient Location: PACU  Anesthesia Type:Spinal  Level of Consciousness: awake, alert  and oriented  Airway & Oxygen Therapy: Patient Spontanous Breathing and Patient connected to face mask oxygen  Post-op Assessment: Report given to RN and Post -op Vital signs reviewed and stable  Post vital signs: Reviewed and stable  Last Vitals:  Vitals Value Taken Time  BP 96/70 03/31/21 1251  Temp    Pulse 58 03/31/21 1252  Resp 10 03/31/21 1252  SpO2 100 % 03/31/21 1252  Vitals shown include unvalidated device data.  Last Pain:  Vitals:   03/31/21 0937  TempSrc:   PainSc: 0-No pain      Patients Stated Pain Goal: 4 (03/31/21 0816)  Complications: No notable events documented.

## 2021-03-31 NOTE — Anesthesia Postprocedure Evaluation (Signed)
Anesthesia Post Note  Patient: Jason Thomas  Procedure(s) Performed: TOTAL KNEE ARTHROPLASTY (Right: Knee)     Patient location during evaluation: Nursing Unit Anesthesia Type: Regional Level of consciousness: oriented and awake and alert Pain management: pain level controlled Vital Signs Assessment: post-procedure vital signs reviewed and stable Respiratory status: spontaneous breathing and respiratory function stable Cardiovascular status: blood pressure returned to baseline and stable Postop Assessment: no headache, no backache, no apparent nausea or vomiting and patient able to bend at knees Anesthetic complications: no   No notable events documented.  Last Vitals:  Vitals:   03/31/21 1315 03/31/21 1330  BP: (!) 97/59 101/69  Pulse: (!) 52 (!) 48  Resp: 15 11  Temp: (!) 36.4 C   SpO2: 95% 94%    Last Pain:  Vitals:   03/31/21 1330  TempSrc:   PainSc: 0-No pain                 Trevor Iha

## 2021-03-31 NOTE — Anesthesia Procedure Notes (Addendum)
Anesthesia Regional Block: Adductor canal block   Pre-Anesthetic Checklist: , timeout performed,  Correct Patient, Correct Site, Correct Laterality,  Correct Procedure, Correct Position, site marked,  Risks and benefits discussed,  Surgical consent,  Pre-op evaluation,  At surgeon's request and post-op pain management  Laterality: Lower and Right  Prep: chloraprep       Needles:  Injection technique: Single-shot  Needle Type: Echogenic Needle     Needle Length: 9cm  Needle Gauge: 22     Additional Needles:   Procedures:,,,, ultrasound used (permanent image in chart),,    Narrative:  Start time: 03/31/2021 9:26 AM End time: 03/31/2021 9:32 AM Injection made incrementally with aspirations every 5 mL.  Performed by: Personally  Anesthesiologist: Trevor Iha, MD  Additional Notes: Block assessed prior to surgery. Pt tolerated procedure well.

## 2021-03-31 NOTE — Progress Notes (Signed)
AssistedDr. Houser with right, ultrasound guided, adductor canal block. Side rails up, monitors on throughout procedure. See vital signs in flow sheet. Tolerated Procedure well.  

## 2021-03-31 NOTE — Brief Op Note (Signed)
03/31/2021  12:51 PM  PATIENT:  Jason Thomas  60 y.o. male  PRE-OPERATIVE DIAGNOSIS:  OA RIGHT KNEE  POST-OPERATIVE DIAGNOSIS:  OA RIGHT KNEE  PROCEDURE:  Procedure(s): TOTAL KNEE ARTHROPLASTY (Right)  SURGEON:  Surgeon(s) and Role:    Frederico Hamman, MD - Primary  PHYSICIAN ASSISTANT: Margart Sickles, PA-C  ASSISTANTS: OR staff x1   ANESTHESIA:   local, regional, spinal, and IV sedation  EBL:  50 mL   BLOOD ADMINISTERED:none  DRAINS: none   LOCAL MEDICATIONS USED:  MARCAINE     SPECIMEN:  No Specimen  DISPOSITION OF SPECIMEN:  N/A  COUNTS:  YES  TOURNIQUET:   Total Tourniquet Time Documented: Thigh (Right) - 62 minutes Total: Thigh (Right) - 62 minutes   DICTATION: .Other Dictation: Dictation Number Unknown  PLAN OF CARE: Discharge to home after PACU  PATIENT DISPOSITION:  PACU - hemodynamically stable.   Delay start of Pharmacological VTE agent (>24hrs) due to surgical blood loss or risk of bleeding: yes

## 2021-03-31 NOTE — Discharge Instructions (Signed)

## 2021-04-01 NOTE — Op Note (Signed)
NAME: Jason Thomas, Jason Thomas MEDICAL RECORD NO: [id number] ACCOUNT NO: [id number] DATE OF BIRTH: Aug 10, 1960 FACILITY: [facility] LOCATION: [facility] PHYSICIAN: W D. 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  PROCEDURE:  Right total knee replacement (Attune cemented knee, size 7 femur, tibia, 7 mm bearing with 41 mm all poly patella.  SURGEON:  W D. Carloyn Manner., MD  ASSISTANTVincent Peyer  ANESTHESIA:  Spinal with local.  TOURNIQUET TIME:  61 minutes.  DESCRIPTION OF PROCEDURE:  Straight skin incision with a medial parapatellar approach to be made.  We did a 5-degree 10 mm valgus cut on the femur cutting about 3 mm below the most diseased medial compartment with relatively severe varus deformity noted.   Stripped the medial side of the knee as well.  We sized the femur to be a size 7, placed in all-in-1 cutting block in the appropriate degree of external rotation and cutting the anterior, posterior chamfer cuts.  PCL was released.  Osteophytes were  removed from the posterior aspect of the knee.  We infiltrated the subcutaneous and capsular tissues with a mixture of Marcaine and Exparel.  Tibia was sized to be a size 7, placed in the keel cut for the tibia.  Flexion gap equal to the extension gap at  7 mm.  Patella was cut, resecting 9.5 mm of patella and placement of a 41 mm all poly trial.  The patient was noted to have a 15-degree flexion contracture with full extension noted with the trials in resolution of his varus deformity and good stability  in all planes of flexion.  Cement was prepared on the back table using antibiotic impregnated cement due to the patient having MRSA positivity.  Components were inserted tibia followed by femur, patella with the trial bearing.  Trial bearing was   removed.  Small bits of cement were removed from the posterior aspect of the knee.  Tourniquet was released under direct vision.  No excessive bleeding was noted.  Final bearing was placed.  Closure was effected with #1 Ethibond, 2-0 Vicryl, and Monocryl  in the skin.  Taken to recovery room in stable condition.   PUS D: 03/31/2021 12:28:25 pm T: 04/01/2021 12:46:00 am  JOB: [id number]/ [id number]

## 2021-04-04 ENCOUNTER — Encounter (HOSPITAL_COMMUNITY): Payer: Self-pay | Admitting: Orthopedic Surgery

## 2021-10-10 ENCOUNTER — Ambulatory Visit (HOSPITAL_COMMUNITY)
Admission: RE | Admit: 2021-10-10 | Discharge: 2021-10-10 | Disposition: A | Discharge status: Home / Self Care | Payer: Medicare Other | Source: Ambulatory Visit | Attending: Orthopedic Surgery | Admitting: Orthopedic Surgery

## 2021-10-10 ENCOUNTER — Other Ambulatory Visit (HOSPITAL_COMMUNITY): Payer: Self-pay | Admitting: Orthopedic Surgery

## 2021-10-10 DIAGNOSIS — R600 Localized edema: Secondary | ICD-10-CM

## 2023-06-12 ENCOUNTER — Other Ambulatory Visit: Payer: Self-pay | Admitting: *Deleted

## 2023-06-12 DIAGNOSIS — M79606 Pain in leg, unspecified: Secondary | ICD-10-CM

## 2023-06-24 NOTE — Progress Notes (Deleted)
 Office Note     CC:  Bilateral lower extremity claudication  Requesting Provider:  Street, Stephanie Coup, *  HPI: Jason Thomas is a 63 y.o. (15-Jul-1960) male presenting at the request of .Street, Stephanie Coup, MD ***  The pt is *** on a statin for cholesterol management.  The pt is *** on a daily aspirin.   Other AC:  *** The pt is *** on medication for hypertension.   The pt is *** diabetic.  Tobacco hx:  ***  Past Medical History:  Diagnosis Date   AICD (automatic cardioverter/defibrillator) present    Arthritis 2019   CAD (coronary artery disease)    CHF (congestive heart failure) (HCC)    Chronic pain    Chronic systolic heart failure (HCC)    GERD (gastroesophageal reflux disease)    History of kidney stones    Hyperlipidemia    Hypertension    Ischemic cardiomyopathy    Myocardial infarction [facility])    2013   Presence of permanent cardiac pacemaker     Past Surgical History:  Procedure Laterality Date   ANTERIOR CERVICAL DECOMP/DISCECTOMY FUSION N/A 08/10/2019   Procedure: ANTERIOR CERVICAL DECOMPRESSION/DISCECTOMY FUSION, INTERBODY PROSTHESIS, PLATE/SCREWS CERVICAL THREE- CERVICAL FOUR, CERVICAL FIVE CERVICAL SIX;  Surgeon: Tressie Stalker, MD;  Location: [facility] OR;  Service: Neurosurgery;  Laterality: N/A;  anterior   CARDIAC CATHETERIZATION     CORONARY ANGIOPLASTY     INSERT / REPLACE / REMOVE PACEMAKER     neck fusion     TOTAL KNEE ARTHROPLASTY Left 08/26/2020   Procedure: TOTAL KNEE ARTHROPLASTY;  Surgeon: Frederico Hamman, MD;  Location: [facility];  Service: Orthopedics;  Laterality: Left;   TOTAL KNEE ARTHROPLASTY Right 03/31/2021   Procedure: TOTAL KNEE ARTHROPLASTY;  Surgeon: Frederico Hamman, MD;  Location: [facility];  Service: Orthopedics;  Laterality: Right;    Social History   Socioeconomic History   Marital status: Married    Spouse name: Not on file   Number of children: Not on file   Years of education: Not on file   Highest education level: Not on file   Occupational History   Not on file  Tobacco Use   Smoking status: Former    Types: Cigarettes   Smokeless tobacco: Never   Tobacco comments:    quit in 2013  Vaping Use   Vaping status: Never Used  Substance and Sexual Activity   Alcohol use: Never   Drug use: Never   Sexual activity: Yes  Other Topics Concern   Not on file  Social History Narrative   Not on file   Social Drivers of Health   Financial Resource Strain: Not on file  Food Insecurity: Not on file  Transportation Needs: Not on file  Physical Activity: Not on file  Stress: Not on file  Social Connections: Unknown (09/11/2021)   Received from [facility]   Social Network    Social Network: Not on file  Intimate Partner Violence: Unknown (08/03/2021)   Received from [facility]   HITS    Physically Hurt: Not on file    Insult or Talk Down To: Not on file    Threaten Physical Harm: Not on file    Scream or Curse: Not on file   *** Family History  Problem Relation Age of Onset   CAD Father     Current Outpatient Medications  Medication Sig Dispense Refill   amiodarone (PACERONE) 200 MG tablet Take 100 mg by mouth daily.  atorvastatin (LIPITOR) 80 MG tablet Take 80 mg by mouth daily.      buPROPion (WELLBUTRIN SR) 150 MG 12 hr tablet Take 150 mg by mouth 2 (two) times daily.     carvedilol (COREG) 12.5 MG tablet Take 12.5 mg by mouth in the morning and at bedtime.      Coenzyme Q10 (COQ10) 200 MG CAPS Take 200 mg by mouth daily.     docusate sodium (COLACE) 100 MG capsule Take 100 mg by mouth daily.     ezetimibe (ZETIA) 10 MG tablet Take 10 mg by mouth daily.     famotidine (PEPCID) 40 MG tablet Take 40 mg by mouth at bedtime.     furosemide (LASIX) 40 MG tablet Take 1 tablet (40 mg total) by mouth daily. (Patient taking differently: Take 20 mg by mouth daily.) 30 tablet 0   lisinopril (ZESTRIL) 5 MG tablet Take 1 tablet (5 mg total) by mouth in the morning and at  bedtime. Resume in 1 week     methadone (DOLOPHINE) 10 MG/5ML solution Take 20 mg by mouth daily.     methocarbamol (ROBAXIN) 500 MG tablet Take 1 tablet (500 mg total) by mouth every 6 (six) hours as needed for muscle spasms. 60 tablet 0   Multiple Vitamins-Minerals (MULTIVITAMIN WITH MINERALS) tablet Take 1 tablet by mouth daily.     nitroGLYCERIN (NITROSTAT) 0.4 MG SL tablet Place 0.4 mg under the tongue every 5 (five) minutes as needed for chest pain.      Omega-3 Fatty Acids (FISH OIL) 1000 MG CAPS Take 1,000 mg by mouth daily.     oxyCODONE (OXY IR/ROXICODONE) 5 MG immediate release tablet Take one tab po q4-6hrs prn pain 40 tablet 0   pantoprazole (PROTONIX) 40 MG tablet Take 40 mg by mouth daily.     rivaroxaban (XARELTO) 20 MG TABS tablet Take 20 mg by mouth daily with supper.     RIVAROXABAN (XARELTO) VTE STARTER PACK (15 & 20 MG) Follow package directions: Take one 15mg  tablet by mouth twice a day. On day 22, switch to one 20mg  tablet once a day. Take with food. (Patient not taking: Reported on 03/16/2021) 51 each 0   spironolactone (ALDACTONE) 25 MG tablet Take 25 mg by mouth daily.     No current facility-administered medications for this visit.    Allergies  Allergen Reactions   Prednisone Other (See Comments)    Significant increase in violent temper,which led to an arrest for disorderly conduct.     REVIEW OF SYSTEMS:  *** [X]  denotes positive finding, [ ]  denotes negative finding Cardiac  Comments:  Chest pain or chest pressure:    Shortness of breath upon exertion:    Short of breath when lying flat:    Irregular heart rhythm:        Vascular    Pain in calf, thigh, or hip brought on by ambulation:    Pain in feet at night that wakes you up from your sleep:     Blood clot in your veins:    Leg swelling:         Pulmonary    Oxygen at home:    Productive cough:     Wheezing:         Neurologic    Sudden weakness in arms or legs:     Sudden numbness in arms  or legs:     Sudden onset of difficulty speaking or slurred speech:    Temporary loss of  vision in one eye:     Problems with dizziness:         Gastrointestinal    Blood in stool:     Vomited blood:         Genitourinary    Burning when urinating:     Blood in urine:        Psychiatric    Major depression:         Hematologic    Bleeding problems:    Problems with blood clotting too easily:        Skin    Rashes or ulcers:        Constitutional    Fever or chills:      PHYSICAL EXAMINATION:  There were no vitals filed for this visit.  General:  WDWN in NAD; vital signs documented above Gait: Not observed HENT: WNL, normocephalic Pulmonary: normal non-labored breathing , without wheezing Cardiac: {Desc; regular/irreg:14544} HR Abdomen: soft, NT, no masses Skin: {With/Without:20273} rashes Vascular Exam/Pulses:  Right Left  Radial {Exam; arterial pulse strength 0-4:30167} {Exam; arterial pulse strength 0-4:30167}  Ulnar {Exam; arterial pulse strength 0-4:30167} {Exam; arterial pulse strength 0-4:30167}  Femoral {Exam; arterial pulse strength 0-4:30167} {Exam; arterial pulse strength 0-4:30167}  Popliteal {Exam; arterial pulse strength 0-4:30167} {Exam; arterial pulse strength 0-4:30167}  DP {Exam; arterial pulse strength 0-4:30167} {Exam; arterial pulse strength 0-4:30167}  PT {Exam; arterial pulse strength 0-4:30167} {Exam; arterial pulse strength 0-4:30167}   Extremities: {With/Without:20273} ischemic changes, {With/Without:20273} Gangrene , {With/Without:20273} cellulitis; {With/Without:20273} open wounds;  Musculoskeletal: no muscle wasting or atrophy  Neurologic: A&O X 3;  No focal weakness or paresthesias are detected Psychiatric:  The pt has {Desc; normal/abnormal:11317::"Normal"} affect.   Non-Invasive Vascular Imaging:   ***    ASSESSMENT/PLAN: Jason Thomas is a 63 y.o. male presenting with ***   ***   Victorino Sparrow, MD [facility] [phone]

## 2023-06-27 ENCOUNTER — Encounter: Payer: Medicare Other | Admitting: Vascular Surgery

## 2023-06-27 ENCOUNTER — Ambulatory Visit (HOSPITAL_COMMUNITY): Payer: Medicare Other | Attending: Vascular Surgery

## 2023-08-15 NOTE — Progress Notes (Signed)
 Patient ID: Jason Thomas, male   DOB: 12/23/1960, 63 y.o.   MRN: [id number]  Reason for Consult: New Patient (Initial Visit)   Referred by Street, Lonni HERO, *  Subjective:     HPI  Jason Thomas is a 63 y.o. male presenting for evaluation of bilateral foot pain.  Reports that he has bilateral foot pain and swelling when walking.  He also feels tightness like his skin is stretching or like he is walking on glass.  He has significant pain on the plantar surface of his foot and also reports that he can do okay walking for some time but once he rests he feels that they tighten up and feels the ripping sensation when he goes to move again.  He denies nonhealing wounds or ulceration.  He denies calf claudication.  Past Medical History:  Diagnosis Date   AICD (automatic cardioverter/defibrillator) present    Arthritis 2019   CAD (coronary artery disease)    CHF (congestive heart failure) (HCC)    Chronic pain    Chronic systolic heart failure (HCC)    GERD (gastroesophageal reflux disease)    History of kidney stones    Hyperlipidemia    Hypertension    Ischemic cardiomyopathy    Myocardial infarction [facility])    2013   Peripheral vascular disease (HCC)    Presence of permanent cardiac pacemaker    Family History  Problem Relation Age of Onset   CAD Father    Past Surgical History:  Procedure Laterality Date   ANTERIOR CERVICAL DECOMP/DISCECTOMY FUSION N/A 08/10/2019   Procedure: ANTERIOR CERVICAL DECOMPRESSION/DISCECTOMY FUSION, INTERBODY PROSTHESIS, PLATE/SCREWS CERVICAL THREE- CERVICAL FOUR, CERVICAL FIVE CERVICAL SIX;  Surgeon: Mavis Purchase, MD;  Location: [facility] OR;  Service: Neurosurgery;  Laterality: N/A;  anterior   CARDIAC CATHETERIZATION     CORONARY ANGIOPLASTY     INSERT / REPLACE / REMOVE PACEMAKER     neck fusion     TOTAL KNEE ARTHROPLASTY Left 08/26/2020   Procedure: TOTAL KNEE ARTHROPLASTY;  Surgeon: Shari Sieving, MD;  Location: [facility];  Service: Orthopedics;   Laterality: Left;   TOTAL KNEE ARTHROPLASTY Right 03/31/2021   Procedure: TOTAL KNEE ARTHROPLASTY;  Surgeon: Shari Sieving, MD;  Location: [facility];  Service: Orthopedics;  Laterality: Right;    Short Social History:  Social History   Tobacco Use   Smoking status: Former    Types: Cigarettes   Smokeless tobacco: Never   Tobacco comments:    quit in 2013  Substance Use Topics   Alcohol use: Never    Allergies  Allergen Reactions   Prednisone Other (See Comments)    Significant increase in violent temper,which led to an arrest for disorderly conduct.    Current Outpatient Medications  Medication Sig Dispense Refill   atorvastatin  (LIPITOR ) 80 MG tablet Take 80 mg by mouth daily.      buPROPion  (WELLBUTRIN  SR) 150 MG 12 hr tablet Take 150 mg by mouth 2 (two) times daily.     carvedilol  (COREG ) 12.5 MG tablet Take 12.5 mg by mouth in the morning and at bedtime.      docusate sodium  (COLACE) 100 MG capsule Take 100 mg by mouth daily.     ezetimibe  (ZETIA ) 10 MG tablet Take 10 mg by mouth daily.     famotidine  (PEPCID ) 40 MG tablet Take 40 mg by mouth at bedtime.     furosemide  (LASIX ) 40 MG tablet Take 1 tablet (40 mg total) by mouth daily. (Patient taking differently: Take  20 mg by mouth daily.) 30 tablet 0   lisinopril  (ZESTRIL ) 5 MG tablet Take 1 tablet (5 mg total) by mouth in the morning and at bedtime. Resume in 1 week     methadone (DOLOPHINE) 10 MG/5ML solution Take 20 mg by mouth daily.     Multiple Vitamins-Minerals (MULTIVITAMIN WITH MINERALS) tablet Take 1 tablet by mouth daily.     nitroGLYCERIN  (NITROSTAT ) 0.4 MG SL tablet Place 0.4 mg under the tongue every 5 (five) minutes as needed for chest pain.      Omega-3 Fatty Acids (FISH OIL) 1000 MG CAPS Take 1,000 mg by mouth daily.     pantoprazole  (PROTONIX ) 40 MG tablet Take 40 mg by mouth daily.     Potassium Chloride ER 20 MEQ TBCR Take 20 mEq by mouth 1 day or 1 dose.     spironolactone  (ALDACTONE ) 25 MG tablet Take  25 mg by mouth daily.     amiodarone  (PACERONE ) 200 MG tablet Take 100 mg by mouth daily. (Patient not taking: Reported on 08/16/2023)     Coenzyme Q10 (COQ10) 200 MG CAPS Take 200 mg by mouth daily. (Patient not taking: Reported on 08/16/2023)     methocarbamol  (ROBAXIN ) 500 MG tablet Take 1 tablet (500 mg total) by mouth every 6 (six) hours as needed for muscle spasms. (Patient not taking: Reported on 08/16/2023) 60 tablet 0   oxyCODONE  (OXY IR/ROXICODONE ) 5 MG immediate release tablet Take one tab po q4-6hrs prn pain (Patient not taking: Reported on 08/16/2023) 40 tablet 0   rivaroxaban  (XARELTO ) 20 MG TABS tablet Take 20 mg by mouth daily with supper. (Patient not taking: Reported on 08/16/2023)     RIVAROXABAN  (XARELTO ) VTE STARTER PACK (15 & 20 MG) Follow package directions: Take one 15mg  tablet by mouth twice a day. On day 22, switch to one 20mg  tablet once a day. Take with food. (Patient not taking: Reported on 03/16/2021) 51 each 0   No current facility-administered medications for this visit.    REVIEW OF SYSTEMS  Positive for shortness of breath with activity  All other systems were reviewed and are negative     Objective:  Objective   Vitals:   08/16/23 0939  BP: 129/78  Pulse: 70  Resp: (!) 22  Temp: 97.9 F (36.6 C)  TempSrc: Temporal  SpO2: 94%  Weight: (!) 301 lb 11.2 oz (136.9 kg)  Height: 6' (1.829 m)   Body mass index is 40.92 kg/m.  Physical Exam General: no acute distress Cardiac: hemodynamically stable Pulm: normal work of breathing Neuro: alert, no focal deficit Extremities: Mild edema limited to ankles bilaterally, no wounds or cyanosis Vascular:   Right: Palpable DP  Left: Palpable DP   Data: ABI +---------+------------------+-----+---------+--------+  Right   Rt Pressure (mmHg)IndexWaveform Comment   +---------+------------------+-----+---------+--------+  Brachial 133                                        +---------+------------------+-----+---------+--------+  ATA     149               1.12 triphasic          +---------+------------------+-----+---------+--------+  PTA     168               1.26 triphasic          +---------+------------------+-----+---------+--------+  Jason Thomas  0.92                    +---------+------------------+-----+---------+--------+   +---------+------------------+-----+---------+-------+  Left    Lt Pressure (mmHg)IndexWaveform Comment  +---------+------------------+-----+---------+-------+  Brachial 120                                      +---------+------------------+-----+---------+-------+  ATA     150               1.13 triphasic         +---------+------------------+-----+---------+-------+  PTA     152               1.14 triphasic         +---------+------------------+-----+---------+-------+  Great Toe109               0.82                   +---------+------------------+-----+---------+-------+   +-------+-----------+-----------+------------+------------+  ABI/TBIToday's ABIToday's TBIPrevious ABIPrevious TBI  +-------+-----------+-----------+------------+------------+  Right 1.26       0.92                                 +-------+-----------+-----------+------------+------------+  Left  1.14       0.82                                 +-------+-----------+-----------+------------+------------+   DVT study reviewed from 2023 No DVT   Note from PCP at [facility] reviewed     Assessment/Plan:     Jason Thomas is a 63 y.o. male with CHF and CAD.  I explained that he does not have peripheral arterial disease as he has normal perfusion to his feet based on his ABI today.  Explain his symptoms may stem from some nerve damage due to his bilateral knee replacements or potentially even plantar fasciitis.  We discussed some home remedies for plantar fasciitis but encouraged  him to look up some rehab exercises or discussed with his PCP.  Follow-up as needed     Norman GORMAN Serve MD [facility]

## 2023-08-16 ENCOUNTER — Ambulatory Visit (INDEPENDENT_AMBULATORY_CARE_PROVIDER_SITE_OTHER): Payer: Medicare Other | Admitting: Vascular Surgery

## 2023-08-16 ENCOUNTER — Ambulatory Visit (HOSPITAL_COMMUNITY)
Admission: RE | Admit: 2023-08-16 | Discharge: 2023-08-16 | Disposition: A | Discharge status: Home / Self Care | Payer: Medicare Other | Source: Ambulatory Visit | Attending: Vascular Surgery | Admitting: Vascular Surgery

## 2023-08-16 ENCOUNTER — Encounter: Payer: Self-pay | Admitting: Vascular Surgery

## 2023-08-16 VITALS — BP 129/78 | HR 70 | Temp 97.9°F | Resp 22 | Ht 72.0 in | Wt 301.7 lb

## 2023-08-16 DIAGNOSIS — M722 Plantar fascial fibromatosis: Secondary | ICD-10-CM

## 2023-08-16 DIAGNOSIS — M79606 Pain in leg, unspecified: Secondary | ICD-10-CM | POA: Diagnosis present

## 2023-08-16 LAB — VAS US ABI WITH/WO TBI
Left ABI: 1.14
Right ABI: 1.26

## 2023-10-19 ENCOUNTER — Ambulatory Visit (HOSPITAL_BASED_OUTPATIENT_CLINIC_OR_DEPARTMENT_OTHER)
Admission: EM | Admit: 2023-10-19 | Discharge: 2023-10-19 | Disposition: A | Discharge status: Home / Self Care | Attending: Family Medicine | Admitting: Family Medicine

## 2023-10-19 ENCOUNTER — Encounter (HOSPITAL_BASED_OUTPATIENT_CLINIC_OR_DEPARTMENT_OTHER): Payer: Self-pay

## 2023-10-19 DIAGNOSIS — L03031 Cellulitis of right toe: Secondary | ICD-10-CM | POA: Diagnosis not present

## 2023-10-19 MED ORDER — CEPHALEXIN 500 MG PO CAPS
500.0000 mg | ORAL_CAPSULE | Freq: Three times a day (TID) | ORAL | 0 refills | Status: AC
Start: 2023-10-19 — End: 2023-10-26

## 2023-10-19 NOTE — Discharge Instructions (Signed)
 Take the as prescribed.  Recommend warm soaks to the area.  Follow-up with your doctor for any continued issues

## 2023-10-19 NOTE — ED Provider Notes (Signed)
 [facility]    CSN: [id number] Arrival date & time: 10/19/23  1312      History   Chief Complaint Chief Complaint  Patient presents with   Toe Pain    HPI Jason Thomas is a 63 y.o. male.   63 year old male with right great toenail drainage. This has been present for the past 2 days. No pain. Denies swelling. Toenail has came off a few times. No injury. No fever. Reports when he squeezes the nail bloody/purulent drainage comes out.    Toe Pain    Past Medical History:  Diagnosis Date   AICD (automatic cardioverter/defibrillator) present    Arthritis 2019   CAD (coronary artery disease)    CHF (congestive heart failure) (HCC)    Chronic pain    Chronic systolic heart failure (HCC)    GERD (gastroesophageal reflux disease)    History of kidney stones    Hyperlipidemia    Hypertension    Ischemic cardiomyopathy    Myocardial infarction [facility])    2013   Peripheral vascular disease (HCC)    Presence of permanent cardiac pacemaker     Patient Active Problem List   Diagnosis Date Noted   Cervical spondylosis with myelopathy and radiculopathy 08/10/2019   Acute CHF (congestive heart failure) (HCC) 06/15/2019   ARF (acute renal failure) (HCC) 06/15/2019   Arteriosclerosis of coronary artery 10/03/2015   Chronic cervical pain 10/03/2015   Chronic pain 10/03/2015   Degeneration of intervertebral disc of cervical region 10/03/2015   Family history of cardiac disorder 10/03/2015   Ex-smoker 10/03/2015   Acid reflux 10/03/2015   H/O acute myocardial infarction 10/03/2015   HLD (hyperlipidemia) 10/03/2015   BP (high blood pressure) 10/03/2015   Automatic implantable cardioverter-defibrillator in situ 10/03/2015   Low back pain 10/03/2015   Presence of coronary angioplasty implant and graft 10/03/2015   Cardiomyopathy (HCC) 02/07/2015   Chronic systolic heart failure (HCC) 02/07/2015   Chest pain at rest 10/23/2014   Decreased potassium in the blood 10/23/2014    Anemia, iron deficiency 10/23/2014   Chest wall pain 07/11/2012   Cervical nerve root disorder 05/30/2012   Long term current use of opiate analgesic 05/30/2012   Chronic pain associated with significant psychosocial dysfunction 05/30/2012   Pain in shoulder 04/18/2012   Abnormal CAT scan 11/06/2011   Myocardial infarction (HCC) 11/06/2011    Past Surgical History:  Procedure Laterality Date   ANTERIOR CERVICAL DECOMP/DISCECTOMY FUSION N/A 08/10/2019   Procedure: ANTERIOR CERVICAL DECOMPRESSION/DISCECTOMY FUSION, INTERBODY PROSTHESIS, PLATE/SCREWS CERVICAL THREE- CERVICAL FOUR, CERVICAL FIVE CERVICAL SIX;  Surgeon: Mavis Purchase, MD;  Location: [facility] OR;  Service: Neurosurgery;  Laterality: N/A;  anterior   CARDIAC CATHETERIZATION     CORONARY ANGIOPLASTY     INSERT / REPLACE / REMOVE PACEMAKER     neck fusion     TOTAL KNEE ARTHROPLASTY Left 08/26/2020   Procedure: TOTAL KNEE ARTHROPLASTY;  Surgeon: Shari Sieving, MD;  Location: [facility] ORS;  Service: Orthopedics;  Laterality: Left;   TOTAL KNEE ARTHROPLASTY Right 03/31/2021   Procedure: TOTAL KNEE ARTHROPLASTY;  Surgeon: Shari Sieving, MD;  Location: [facility] ORS;  Service: Orthopedics;  Laterality: Right;       Home Medications    Prior to Admission medications   Medication Sig Start Date End Date Taking? Authorizing Provider  cephALEXin (KEFLEX) 500 MG capsule Take 1 capsule (500 mg total) by mouth 3 (three) times daily for 7 days. 10/19/23 10/26/23 Yes Adah Wilbert LABOR, FNP  atorvastatin  (  LIPITOR ) 80 MG tablet Take 80 mg by mouth daily.     [provider]  buPROPion  (WELLBUTRIN  SR) 150 MG 12 hr tablet Take 150 mg by mouth 2 (two) times daily.    [provider]  carvedilol  (COREG ) 12.5 MG tablet Take 12.5 mg by mouth in the morning and at bedtime.     [provider]  docusate sodium  (COLACE) 100 MG capsule Take 100 mg by mouth daily.    [provider]  ezetimibe  (ZETIA ) 10 MG tablet Take 10 mg by  mouth daily.    [provider]  famotidine  (PEPCID ) 40 MG tablet Take 40 mg by mouth at bedtime.    [provider]  furosemide  (LASIX ) 40 MG tablet Take 1 tablet (40 mg total) by mouth daily. Patient taking differently: Take 20 mg by mouth daily. 06/17/19   Elgergawy, Brayton RAMAN, MD  lisinopril  (ZESTRIL ) 5 MG tablet Take 1 tablet (5 mg total) by mouth in the morning and at bedtime. Resume in 1 week 06/23/19   Elgergawy, Brayton RAMAN, MD  methadone (DOLOPHINE) 10 MG/5ML solution Take 20 mg by mouth daily.    [provider]  Multiple Vitamins-Minerals (MULTIVITAMIN WITH MINERALS) tablet Take 1 tablet by mouth daily.    [provider]  nitroGLYCERIN  (NITROSTAT ) 0.4 MG SL tablet Place 0.4 mg under the tongue every 5 (five) minutes as needed for chest pain.     [provider]  Omega-3 Fatty Acids (FISH OIL) 1000 MG CAPS Take 1,000 mg by mouth daily.    [provider]  pantoprazole  (PROTONIX ) 40 MG tablet Take 40 mg by mouth daily.    [provider]  Potassium Chloride ER 20 MEQ TBCR Take 20 mEq by mouth 1 day or 1 dose. 02/07/23   [provider]    Family History Family History  Problem Relation Age of Onset   CAD Father     Social History Social History   Tobacco Use   Smoking status: Former    Types: Cigarettes   Smokeless tobacco: Never   Tobacco comments:    quit in 2013  Vaping Use   Vaping status: Never Used  Substance Use Topics   Alcohol use: Never   Drug use: Never     Allergies   Prednisone   Review of Systems Review of Systems See HPI  Physical Exam Triage Vital Signs ED Triage Vitals  Encounter Vitals Group     BP 10/19/23 1318 (!) 141/85     Girls Systolic BP Percentile --      Girls Diastolic BP Percentile --      Boys Systolic BP Percentile --      Boys Diastolic BP Percentile --      Pulse Rate 10/19/23 1318 75     Resp 10/19/23 1318 20     Temp 10/19/23 1318 98.4 F (36.9 C)      Temp Source 10/19/23 1318 Oral     SpO2 10/19/23 1318 96 %     Weight --      Height --      Head Circumference --      Peak Flow --      Pain Score 10/19/23 1321 0     Pain Loc --      Pain Education --      Exclude from Growth Chart --    No data found.  Updated Vital Signs BP (!) 141/85 (BP Location: Right Arm)   Pulse  75   Temp 98.4 F (36.9 C) (Oral)   Resp 20   SpO2 96%   Visual Acuity Right Eye Distance:   Left Eye Distance:   Bilateral Distance:    Right Eye Near:   Left Eye Near:    Bilateral Near:     Physical Exam Constitutional:      Appearance: Normal appearance.  Pulmonary:     Effort: Pulmonary effort is normal.   Musculoskeletal:        General: Normal range of motion.   Skin:    General: Skin is warm and dry.     Comments: Area around nailbed appears to be red, swollen. Toenail not completely attached.  Thick, discolored toenail   Neurological:     Mental Status: He is alert.   Psychiatric:        Mood and Affect: Mood normal.      UC Treatments / Results  Labs (all labs ordered are listed, but only abnormal results are displayed) Labs Reviewed - No data to display  EKG   Radiology No results found.  Procedures Procedures (including critical care time)  Medications Ordered in UC Medications - No data to display  Initial Impression / Assessment and Plan / UC Course  I have reviewed the triage vital signs and the nursing notes.  Pertinent labs & imaging results that were available during my care of the patient were reviewed by me and considered in my medical decision making (see chart for details).     Abscess to toenail- Pt with active drainage from underneath the toenail. Concern for underlying infection. We will go ahead and treat with oral antibiotics as this time.  Warm soaks to the toe.  See podiatry as needed or F/U here if worsening infection.  Final Clinical Impressions(s) / UC Diagnoses   Final diagnoses:   Abscess of toenail on right foot     Discharge Instructions      Take the as prescribed.  Recommend warm soaks to the area.  Follow-up with your doctor for any continued issues    ED Prescriptions     Medication Sig Dispense Auth. Provider   cephALEXin (KEFLEX) 500 MG capsule Take 1 capsule (500 mg total) by mouth 3 (three) times daily for 7 days. 21 capsule Adah Wilbert LABOR, FNP      PDMP not reviewed this encounter.   Adah Wilbert LABOR, FNP 10/19/23 1506

## 2023-10-19 NOTE — ED Triage Notes (Signed)
 Onset 2 days ago of drainage around nail of great toe.  + redness. States is not diabetic but does have heart problems. Concerned about infection.
